# Patient Record
Sex: Female | Born: 1995 | Race: White | Hispanic: No | Marital: Single | Smoking: Never smoker
Health system: Southern US, Community
[De-identification: ages and names within clinical notes are randomized; demographics above are authoritative.]

## PROBLEM LIST (undated history)

## (undated) DIAGNOSIS — R569 Unspecified convulsions: Secondary | ICD-10-CM

## (undated) HISTORY — PX: HAND SURGERY: SHX662

---

## 2020-12-12 ENCOUNTER — Encounter: Payer: Self-pay | Admitting: Neurology

## 2021-01-01 ENCOUNTER — Other Ambulatory Visit: Payer: Self-pay

## 2021-01-01 ENCOUNTER — Encounter: Payer: Self-pay | Admitting: Neurology

## 2021-01-01 ENCOUNTER — Ambulatory Visit (INDEPENDENT_AMBULATORY_CARE_PROVIDER_SITE_OTHER): Payer: No Typology Code available for payment source | Admitting: Neurology

## 2021-01-01 VITALS — BP 103/71 | HR 115 | Resp 18 | Ht 59.0 in | Wt 118.0 lb

## 2021-01-01 DIAGNOSIS — R569 Unspecified convulsions: Secondary | ICD-10-CM | POA: Diagnosis not present

## 2021-01-01 DIAGNOSIS — Z8249 Family history of ischemic heart disease and other diseases of the circulatory system: Secondary | ICD-10-CM

## 2021-01-01 MED ORDER — LEVETIRACETAM ER 500 MG PO TB24: ORAL_TABLET | ORAL | 11 refills | Status: DC

## 2021-01-01 NOTE — Patient Instructions (Addendum)
1. Schedule MRI brain with and without contrast  2. Schedule MRA head without contrast  3. Schedule 1-hour EEG  4. Start Keppra XR (Levetiracetam ER) 500mg : Take 1 tablet every night  5. Follow-up in 3 months, call for any changes  We have sent a referral to [facility] for your MRI and MRA  they will call you directly to schedule your appointment. They are located at [address] [facility]. If you need to contact them directly please call [phone].

## 2021-01-01 NOTE — Progress Notes (Signed)
NEUROLOGY CONSULTATION NOTE  Michelle Peters MRN: [id number] DOB: 09-16-1996  Referring provider: Dr. Abner Greenspan Primary care provider: Dr. Abner Greenspan  Reason for consult:  seizure  Dear Dr Yetta Flock:  Thank you for your kind referral of Michelle Peters for consultation of the above symptoms. Although her history is well known to you, please allow me to reiterate it for the purpose of our medical record. She is alone in the office today. Records and images were personally reviewed where available.   HISTORY OF PRESENT ILLNESS: This is a 25 year old right-handed woman with a history of remote migraines presenting for evaluation of recurrent nocturnal seizures. The first seizure occurred in October, she does not remember being in the hospital [facility] - records not available for review) and took  2 weeks to feel normal. She felt "dazed, just not there." She is a big reader and likes to do Sudoku and would sit for hours not doing anything. She had another nocturnal seizure on Thanksgiving night and most recently on 10/31/20. They occur between 3-4AM lasting 2 minutes per husband. No tongue bite or incontinence. She denies any change in sleep patterns. She drinks 1 energy drink a day since age 25 which she would drink throughout the day. She had wine Thanksgiving night. She has been told she would tell her husband things she had already told him. Her sister has noticed she would forget they had just talked on the phone. She denies any staring/unresponsive episodes, olfactory/gustatory hallucinations, deja vu, rising epigastric sensation, focal numbness/tingling/weakness, myoclonic jerks. She had a pressure over her right eye 2 weeks ago, making her vision funny/blurred. This lasted for a week, she did not take any medication. 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 She was having these episodes once a week until she became  pregnant and has not had a single migraine since then. She has noticed an increase in epistaxis, she has had 3 recently. She has dizziness where she would be sitting and feel a sensation of movement for 30 seconds, sometimes occurring three times a day, no associated confusion. She has back pain, no bowel/bladder dysfunction. She had 5 concussions in high school while playing hockey, head injury during skiing, no neurosurgical procedures. A maternal cousin had seizures in childhood. Her paternal grandfather had a brain aneurysm. She had a normal birth and early development.  There is no history of febrile convulsions, CNS infections such as meningitis/encephalitis, significant traumatic brain injury.   PAST MEDICAL HISTORY: History reviewed. No pertinent past medical history.  PAST SURGICAL HISTORY: History reviewed. No pertinent surgical history.  MEDICATIONS: No current outpatient medications on file prior to visit.   No current facility-administered medications on file prior to visit.    ALLERGIES: No Known Allergies  FAMILY HISTORY: History reviewed. No pertinent family history.  SOCIAL HISTORY: Social History   Socioeconomic History  . Marital status: Single    Spouse name: Not on file  . Number of children: 1  . Years of education: 28  . Highest education level: Not on file  Occupational History  . Occupation: home maker  Tobacco Use  . Smoking status: Never Smoker  . Smokeless tobacco: Never Used  Vaping Use  . Vaping Use: Never used  Substance and Sexual Activity  . Alcohol use: Yes  . Drug use: Not on file  . Sexual activity: Not on file  Other Topics  Concern  . Not on file  Social History Narrative   Right handed   Drinks caffeine   Two story home   Social Determinants of Health   Financial Resource Strain: Not on file  Food Insecurity: Not on file  Transportation Needs: Not on file  Physical Activity: Not on file  Stress: Not on file  Social  Connections: Not on file  Intimate Partner Violence: Not on file     PHYSICAL EXAM: Vitals:   01/01/21 1036  BP: 103/71  Pulse: (!) 115  Resp: 18  SpO2: 96%   General: No acute distress Head:  Normocephalic/atraumatic Skin/Extremities: No rash, no edema Neurological Exam: Mental status: alert and oriented to person, place, and time, no dysarthria or aphasia, Fund of knowledge is appropriate.  Recent and remote memory are intact, 3/3 delayed recall.  Attention and concentration are normal, 3/5 WORLD backwards. Cranial nerves: CN I: not tested CN II: pupils equal, round and reactive to light, visual fields intact CN III, IV, VI:  full range of motion, no nystagmus, no ptosis CN V: facial sensation intact CN VII: upper and lower face symmetric CN VIII: hearing intact to conversation Bulk & Tone: normal, no fasciculations. Motor: 5/5 throughout with no pronator drift. Sensation: intact to light touch, cold, pin, vibration sense.  No extinction to double simultaneous stimulation.  Romberg test negative Deep Tendon Reflexes: +2 throughout Cerebellar: no incoordination on finger to nose testing Gait: narrow-based and steady, able to tandem walk adequately. Tremor: none   IMPRESSION: This is a 25 year old right-handed woman with a history of remote migraines presenting for evaluation of recurrent nocturnal seizures since October 2021. Etiology unclear, MRI brain with and without contrast and a 1-hour EEG will be ordered to assess for focal abnormalities that increase risk for recurrent seizures. Due to family history of cerebral aneurysm, MRA head without contrast will be done as well. We discussed recommendation to start seizure medication, she is agreeable to starting Levetiracetam ER 500mg  qhs, side effects discussed. Low threshold to increase dose if needed. [place] driving laws were discussed with the patient, and she knows to stop driving after a seizure, until 6 months seizure-free.  Follow-up in 3 months, she knows to call for any changes.  Thank you for allowing me to participate in the care of this patient. Please do not hesitate to call for any questions or concerns.   , M.D.  CC: Dr. Patrcia Dolly

## 2021-01-10 ENCOUNTER — Ambulatory Visit: Payer: No Typology Code available for payment source | Admitting: Neurology

## 2021-01-10 ENCOUNTER — Other Ambulatory Visit: Payer: Self-pay

## 2021-01-10 DIAGNOSIS — Z8249 Family history of ischemic heart disease and other diseases of the circulatory system: Secondary | ICD-10-CM

## 2021-01-10 DIAGNOSIS — R569 Unspecified convulsions: Secondary | ICD-10-CM

## 2021-01-15 NOTE — Procedures (Signed)
ELECTROENCEPHALOGRAM REPORT  Date of Study: 01/10/2021  Patient's Name: Michelle Peters MRN: [id number] Date of Birth: October 18, 1996  Referring Provider: Dr. Patrcia Dolly  Clinical History: This is a 25 year old woman with recurrent nocturnal seizures.   Medications: Keppra XR  Technical Summary: A multichannel digital 1-hour EEG recording measured by the international 10-20 system with electrodes applied with paste and impedances below 5000 ohms performed in our laboratory with EKG monitoring in an awake and asleep patient.  Hyperventilation was not performed. Photic stimulation was performed.  The digital EEG was referentially recorded, reformatted, and digitally filtered in a variety of bipolar and referential montages for optimal display.    Description: The patient is awake and asleep during the recording.  During maximal wakefulness, there is a symmetric, medium voltage 11 Hz posterior dominant rhythm that attenuates with eye opening.  The record is symmetric.  During drowsiness and sleep, there is an increase in theta slowing of the background.  Vertex waves and symmetric sleep spindles were seen. Photic stimulation did not elicit any abnormalities.  There were no epileptiform discharges or electrographic seizures seen.    EKG lead was unremarkable.  Impression: This 1-hour awake and asleep EEG is normal.    Clinical Correlation: A normal EEG does not exclude a clinical diagnosis of epilepsy.  If further clinical questions remain, prolonged EEG may be helpful.  Clinical correlation is advised.   Patrcia Dolly, M.D.

## 2021-01-31 ENCOUNTER — Ambulatory Visit
Admission: RE | Admit: 2021-01-31 | Discharge: 2021-01-31 | Disposition: A | Discharge status: Home / Self Care | Payer: No Typology Code available for payment source | Source: Ambulatory Visit | Attending: Neurology | Admitting: Neurology

## 2021-01-31 IMAGING — MR MR MRA HEAD W/O CM
1 series · 22 of 48 positions shown · IV contrast (multihance)
Comparison: Head CT [DATE]

CLINICAL DATA: Seizure.

EXAM:
MRI HEAD WITHOUT AND WITH CONTRAST
MRA HEAD WITHOUT CONTRAST
TECHNIQUE: Multiplanar, multiecho pulse sequences of the brain and surrounding
structures were obtained without and with intravenous contrast.
Angiographic images of the head were obtained using MRA technique
without contrast.
CONTRAST:  10mL MULTIHANCE GADOBENATE DIMEGLUMINE 529 MG/ML IV SOLN

[Series 3: tof_3d_multi-slab · axial · 0.7mm · 0.35mm/px · z∈[-44,+50]mm · 22 of 143 slices shown]
[im 1/143]
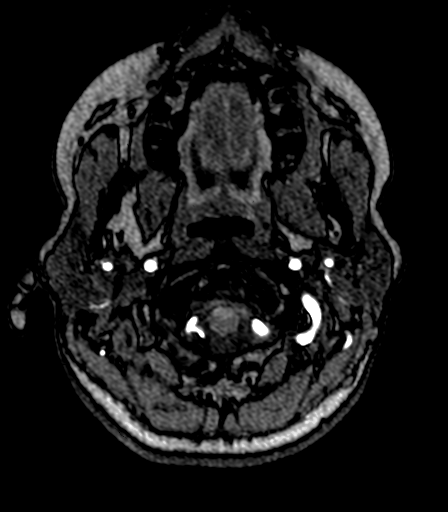
[im 4/143]
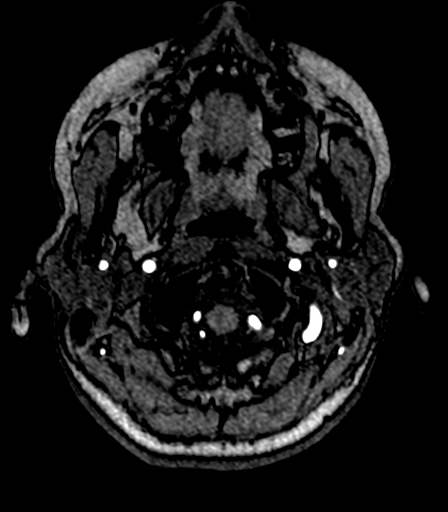
[im 7/143]
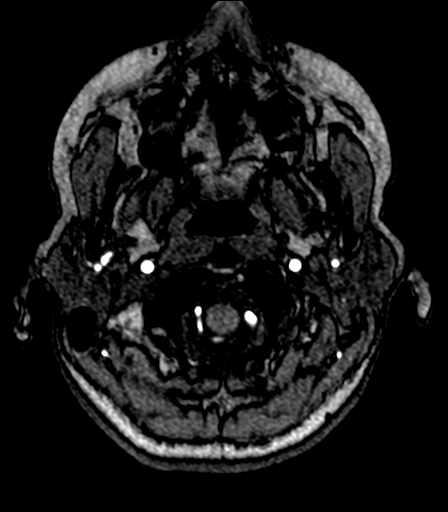
[im 10/143]
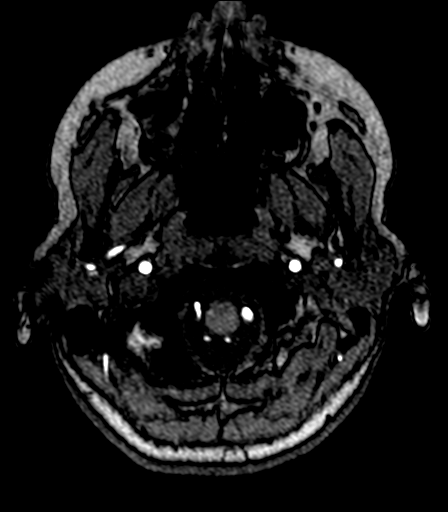
[im 13/143]
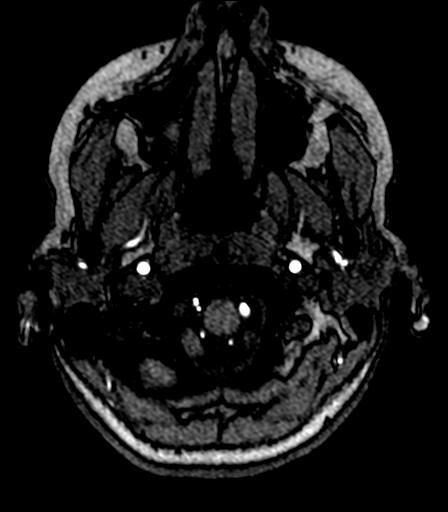
[im 16/143]
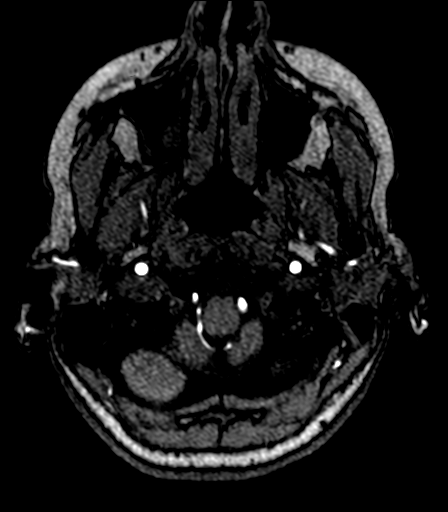
[im 19/143]
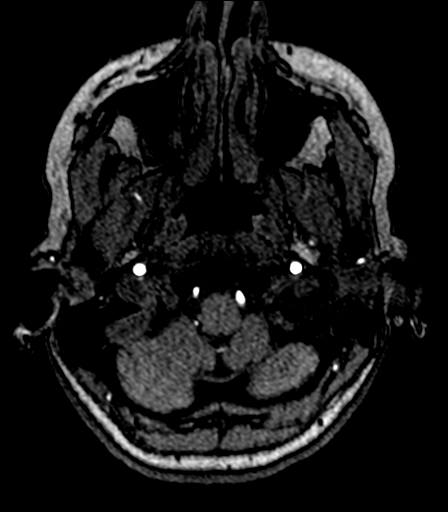
[im 22/143]
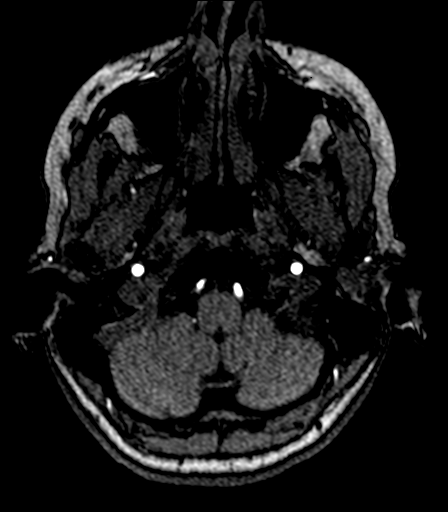
[im 25/143]
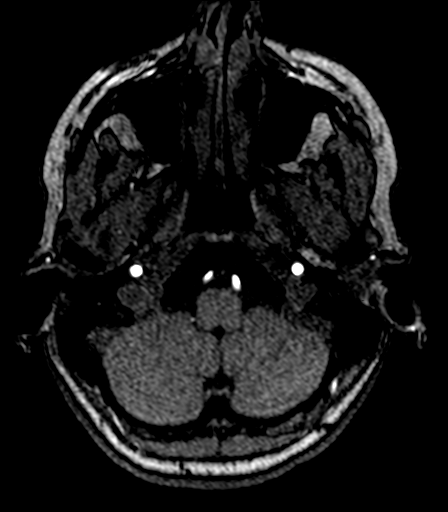
[im 28/143]
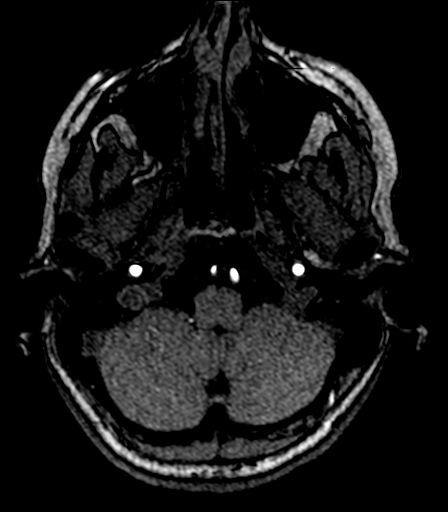
[im 31/143]
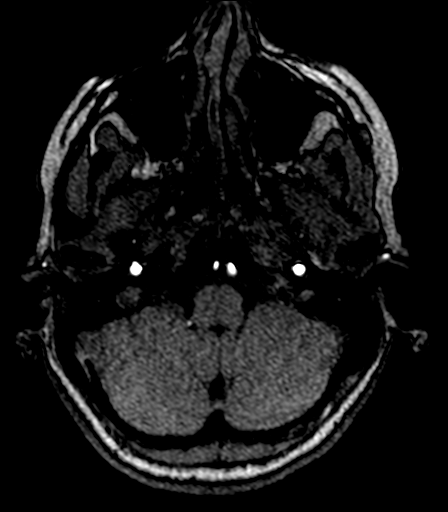
[im 34/143]
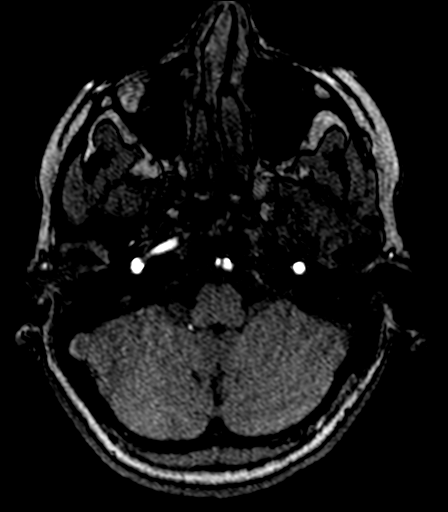
[im 37/143]
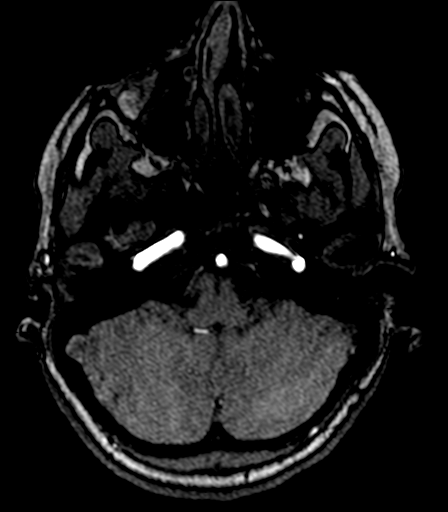
[im 40/143]
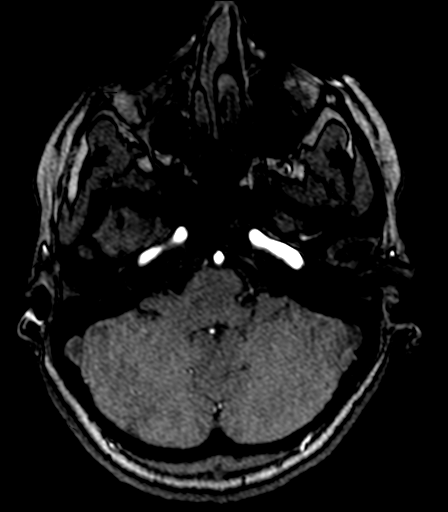
[im 46/143]
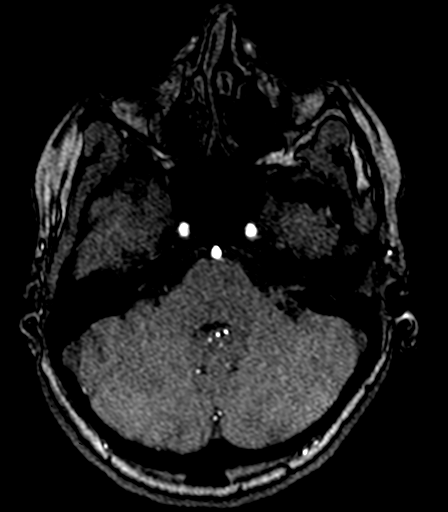
[im 64/143]
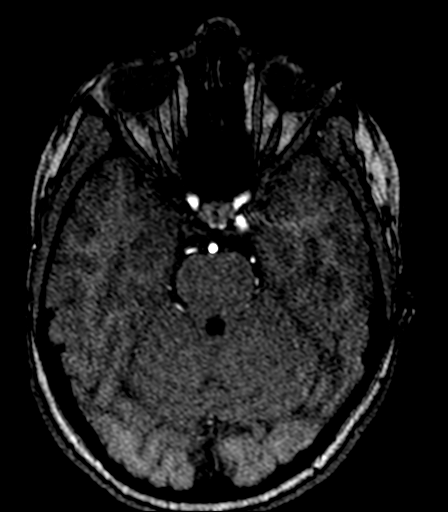
[im 73/143]
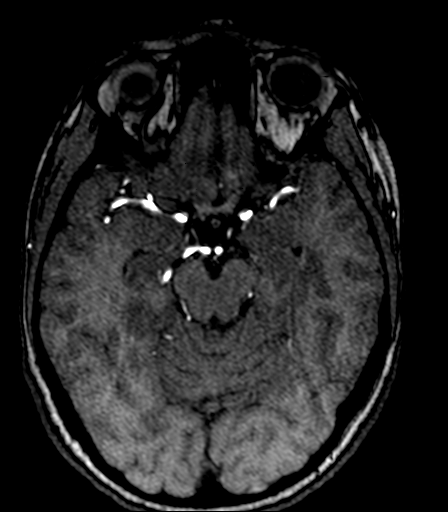
[im 82/143]
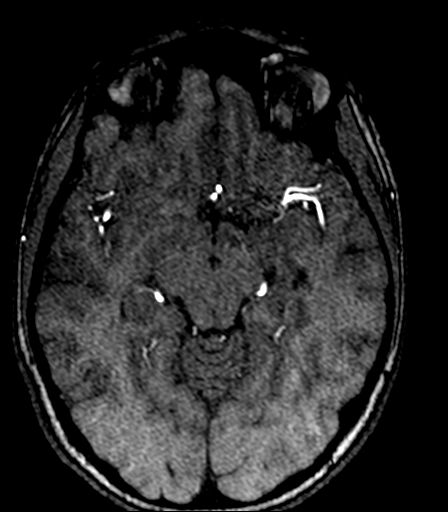
[im 100/143]
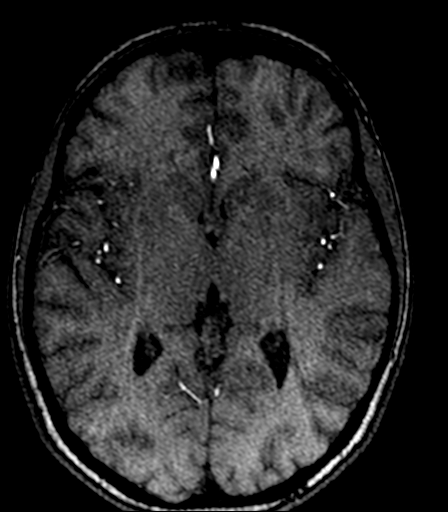
[im 118/143]
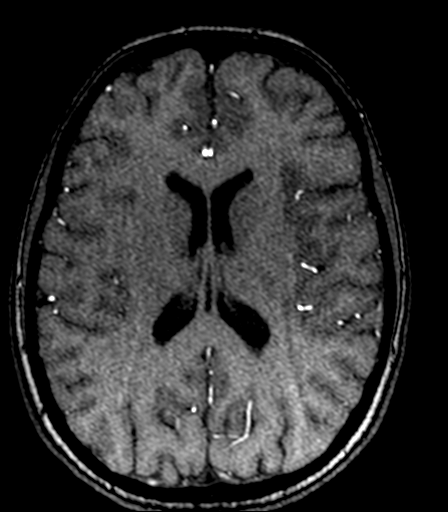
[im 121/143]
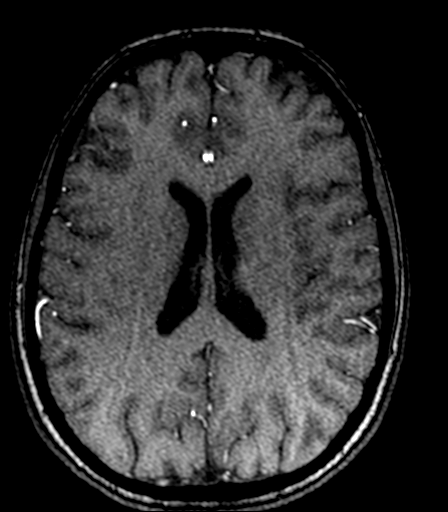
[im 136/143]
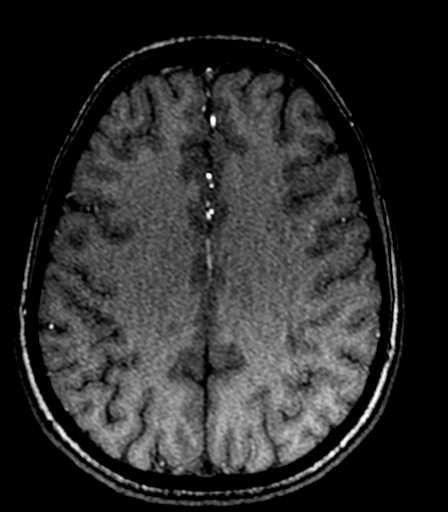

[22 of 48 positions shown; findings below may reference images not displayed]

FINDINGS: MRI HEAD FINDINGS

Brain: No acute infarction, hemorrhage, hydrocephalus, extra-axial
collection or mass lesion. The brain parenchyma has normal signal
characteristics with no evidence of abnormal contrast enhancement.
Incomplete left hippocampal inversion is noted.

Vascular: Normal flow voids.

Skull and upper cervical spine: Normal marrow signal.

Sinuses/Orbits: Mild mucosal thickening of the right maxillary
sinus. The orbits are maintained.

MRA HEAD FINDINGS

The visualized portions of the distal cervical and intracranial
internal carotid arteries are widely patent with normal flow related
enhancement. The bilateral anterior cerebral arteries and middle
cerebral arteries are widely patent with antegrade flow without
high-grade flow-limiting stenosis or proximal branch occlusion. No
intracranial aneurysm within the anterior circulation.

The vertebral arteries are widely patent with antegrade flow.
Vertebrobasilar junction and basilar artery are widely patent with
antegrade flow without evidence of basilar stenosis or aneurysm.
Posterior cerebral arteries are normal bilaterally. No intracranial
aneurysm within the posterior circulation.
IMPRESSION: 1. Incomplete left hippocampal inversion.
2. Otherwise normal MRI of the brain.
3. Normal MRA of the head.

## 2021-01-31 IMAGING — MR MR HEAD WO/W CM
13 of 14 series · 43 of 48 positions shown · IV contrast (multihance)
Comparison: Head CT [DATE]

CLINICAL DATA: Seizure.

EXAM:
MRI HEAD WITHOUT AND WITH CONTRAST
MRA HEAD WITHOUT CONTRAST
TECHNIQUE: Multiplanar, multiecho pulse sequences of the brain and surrounding
structures were obtained without and with intravenous contrast.
Angiographic images of the head were obtained using MRA technique
without contrast.
CONTRAST:  10mL MULTIHANCE GADOBENATE DIMEGLUMINE 529 MG/ML IV SOLN

[Series 2: T1 · sagittal · 5.0mm · 0.45mm/px · 2 of 21 slices shown]
[im 1/21]
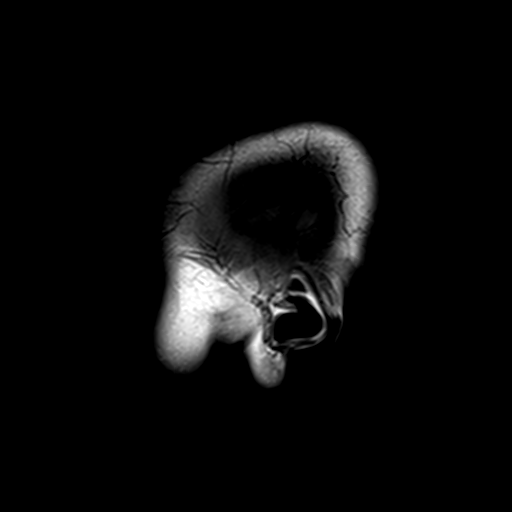
[im 21/21]
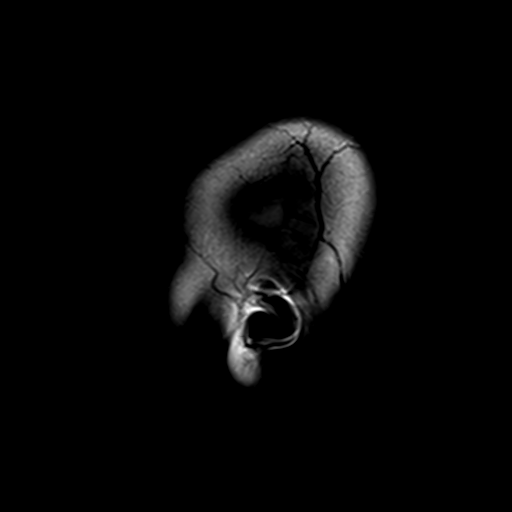

[Series 3: DWI · axial · 3.0mm · 1.80mm/px · z∈[-47,+99]mm · 6 of 99 slices shown (1 of 4)]
[im 1/99]
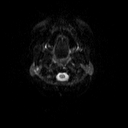
[im 20/99]
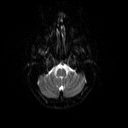
[im 40/99]
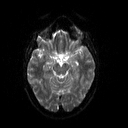
[im 59/99]
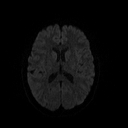
[im 79/99]
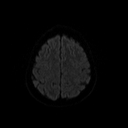
[im 99/99]
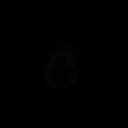

[Series 4: DWI · axial · 3.0mm · 1.80mm/px · z∈[-47,+99]mm · 3 of 48 slices shown (2 of 4)]
[im 1/48]
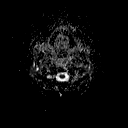
[im 24/48]
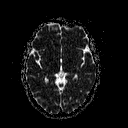
[im 48/48]
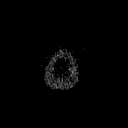

[Series 5: DWI · coronal · 5.0mm · 1.80mm/px · 4 of 68 slices shown (3 of 4)]
[im 1/68]
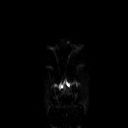
[im 23/68]
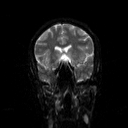
[im 45/68]
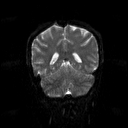
[im 68/68]
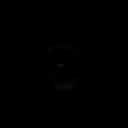

[Series 6: DWI · coronal · 5.0mm · 1.80mm/px · 2 of 34 slices shown (4 of 4)]
[im 1/34]
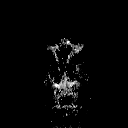
[im 34/34]
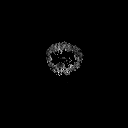

[Series 7: T2 · axial · 5.0mm · 0.60mm/px · 1 of 22 slices shown (1 of 3)]
[im 1/22]
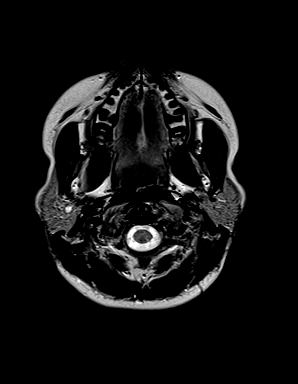

[Series 8: FLAIR · axial · 3.0mm · 0.45mm/px · z∈[-41,+93]mm · 2 of 30 slices shown (1 of 2)]
[im 1/30]
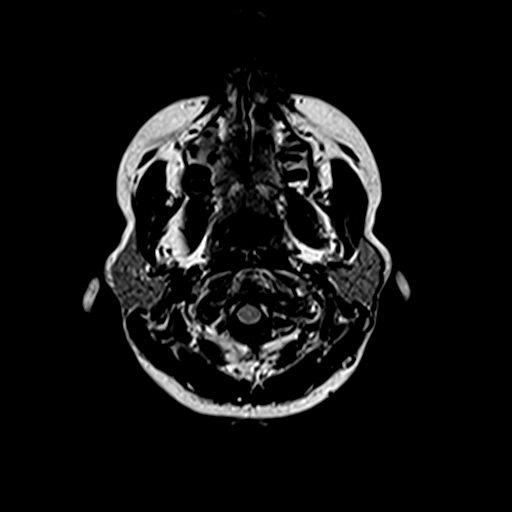
[im 30/30]
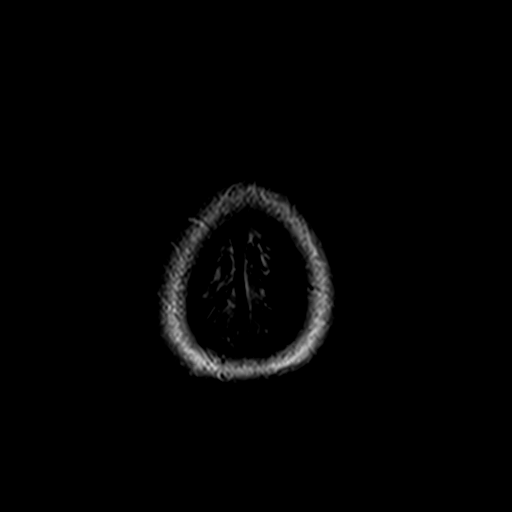

[Series 9: FLAIR · coronal · 3.0mm · 0.40mm/px · 2 of 30 slices shown (2 of 2)]
[im 1/30]
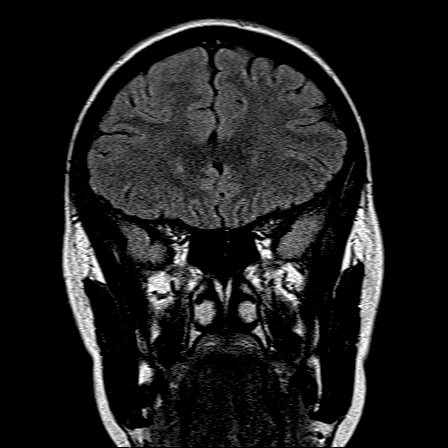
[im 30/30]
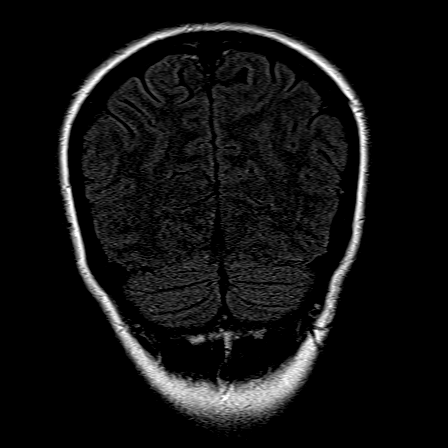

[Series 10: T2 · coronal · 3.0mm · 0.23mm/px · 2 of 30 slices shown (2 of 3)]
[im 1/30]
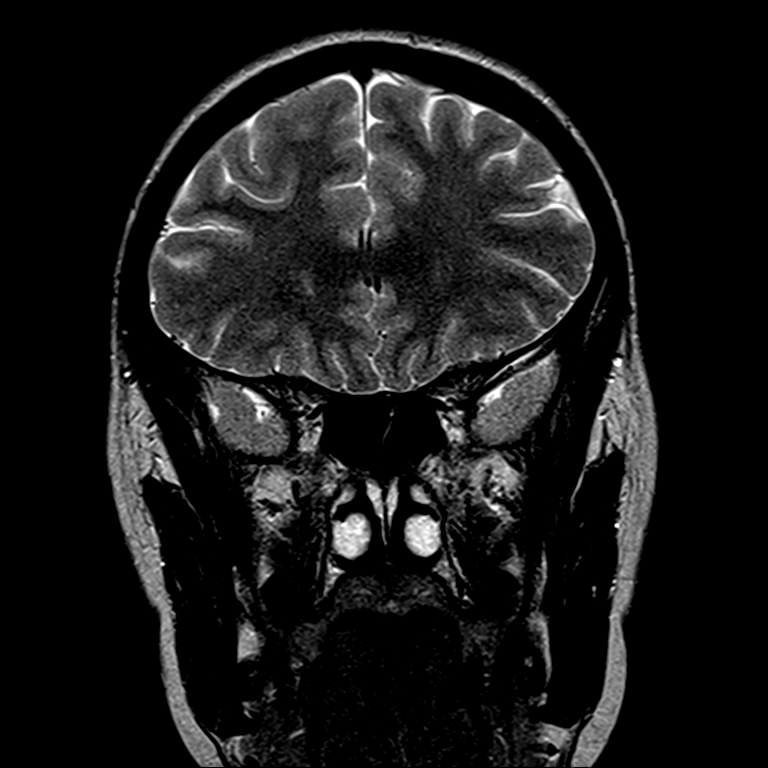
[im 30/30]
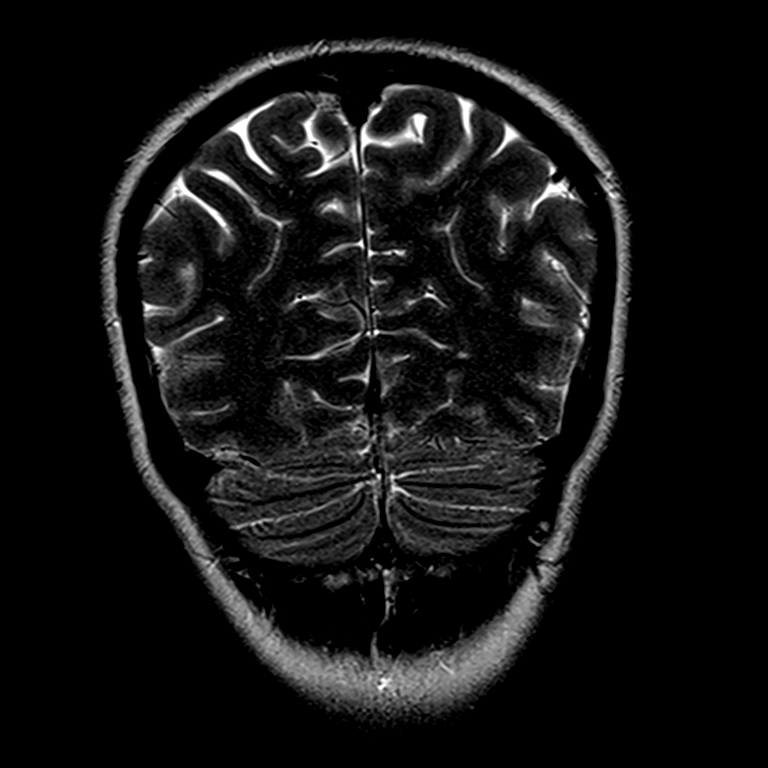

[Series 12: swi_images · axial · 4.0mm · 0.90mm/px · z∈[-43,+96]mm · 2 of 36 slices shown]
[im 1/36]
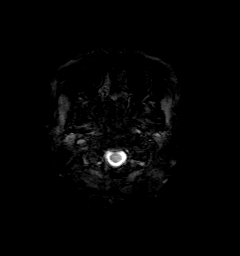
[im 36/36]
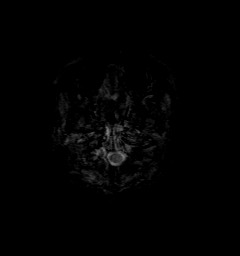

[Series 13: t1_mpr_tra · axial · 1.0mm · 0.75mm/px · z∈[-36,+106]mm · 8 of 144 slices shown]
[im 1/144]
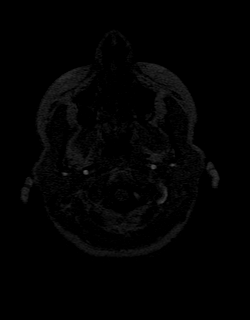
[im 18/144]
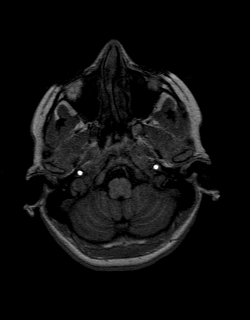
[im 36/144]
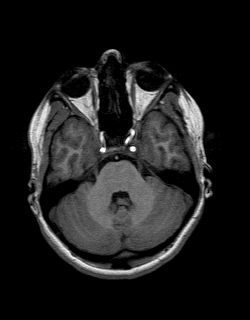
[im 54/144]
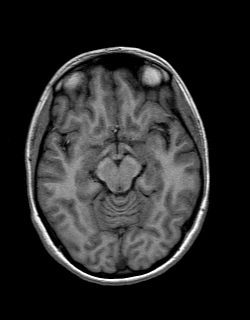
[im 90/144]
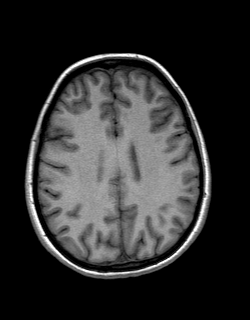
[im 108/144]
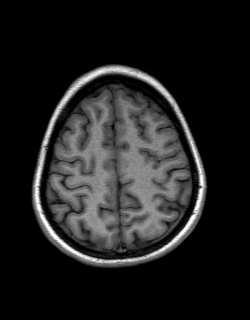
[im 126/144]
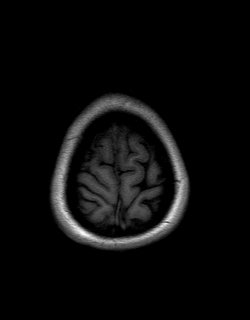
[im 144/144]
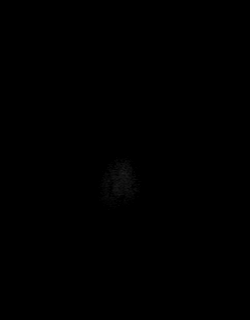

[Series 14: T2 · coronal · 5.0mm · 0.45mm/px · 2 of 25 slices shown (3 of 3)]
[im 1/25]
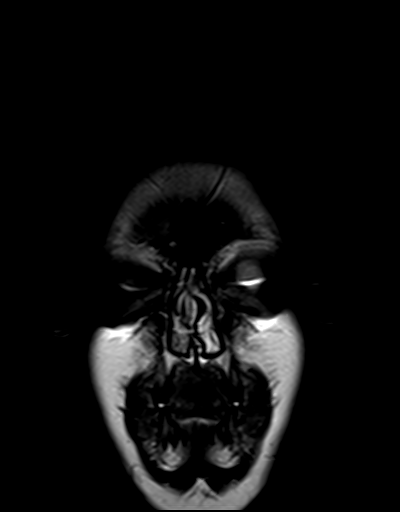
[im 25/25]
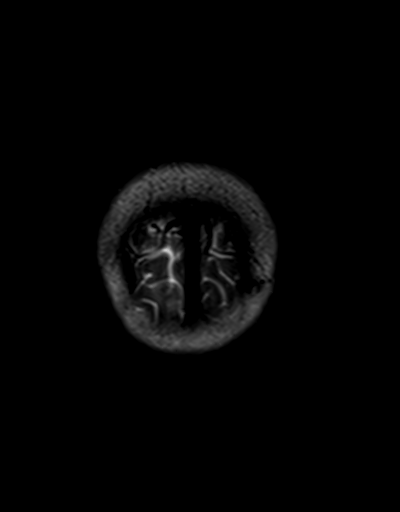

[Series 15: t1_mpr_tra post · axial · 1.0mm · 0.75mm/px · z∈[-36,+89]mm · 7 of 144 slices shown]
[im 1/144]
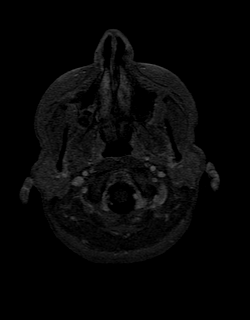
[im 18/144]
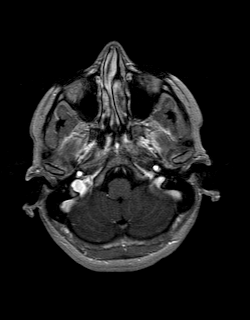
[im 36/144]
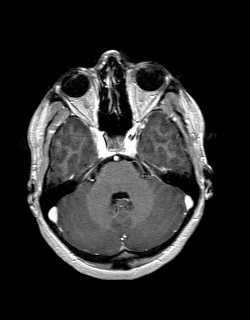
[im 54/144]
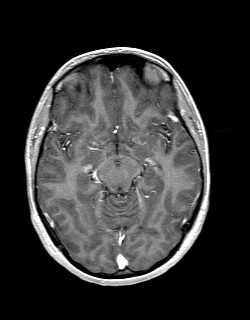
[im 90/144]
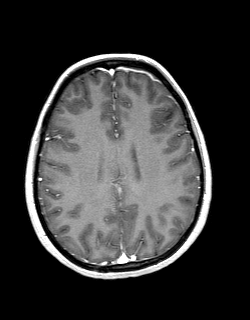
[im 108/144]
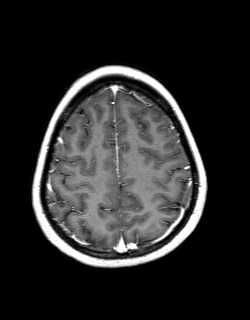
[im 126/144]
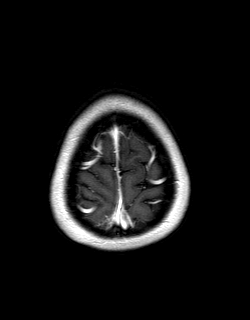

[43 of 48 positions shown; findings below may reference images not displayed]

FINDINGS: MRI HEAD FINDINGS

Brain: No acute infarction, hemorrhage, hydrocephalus, extra-axial
collection or mass lesion. The brain parenchyma has normal signal
characteristics with no evidence of abnormal contrast enhancement.
Incomplete left hippocampal inversion is noted.

Vascular: Normal flow voids.

Skull and upper cervical spine: Normal marrow signal.

Sinuses/Orbits: Mild mucosal thickening of the right maxillary
sinus. The orbits are maintained.

MRA HEAD FINDINGS

The visualized portions of the distal cervical and intracranial
internal carotid arteries are widely patent with normal flow related
enhancement. The bilateral anterior cerebral arteries and middle
cerebral arteries are widely patent with antegrade flow without
high-grade flow-limiting stenosis or proximal branch occlusion. No
intracranial aneurysm within the anterior circulation.

The vertebral arteries are widely patent with antegrade flow.
Vertebrobasilar junction and basilar artery are widely patent with
antegrade flow without evidence of basilar stenosis or aneurysm.
Posterior cerebral arteries are normal bilaterally. No intracranial
aneurysm within the posterior circulation.
IMPRESSION: 1. Incomplete left hippocampal inversion.
2. Otherwise normal MRI of the brain.
3. Normal MRA of the head.

## 2021-01-31 MED ORDER — GADOBENATE DIMEGLUMINE 529 MG/ML IV SOLN
10.0000 mL | Freq: Once | INTRAVENOUS | Status: AC | PRN
  Administered 2021-01-31: 10 mL via INTRAVENOUS

## 2021-02-04 ENCOUNTER — Telehealth: Payer: Self-pay

## 2021-02-04 NOTE — Telephone Encounter (Signed)
-----   Message from Karen M Aquino, MD sent at 02/04/2021 12:51 PM EDT ----- Pls let her know the brain MRI and MRA looked fine, no evidence of tumor, stroke, bleed, or aneurysm. Her EEG as normal, however it is not like a pregnancy test that is positive or negative, it is just a snapshot of her brain waves. How is she feeling on the Keppra? thanks 

## 2021-02-04 NOTE — Telephone Encounter (Signed)
Pt called phone was hung up

## 2021-02-06 ENCOUNTER — Telehealth: Payer: Self-pay

## 2021-02-06 NOTE — Telephone Encounter (Signed)
Pt called to go over MRI results no answer left a voice mail to call the office back

## 2021-02-06 NOTE — Telephone Encounter (Signed)
Pt called back stated that she has not gotten the Keppra she is trying to get pregnant and scared to take any medications, she stated that she did have a seizure 2 weeks ago  Pt given results brain MRI and MRA looked fine, no evidence of tumor, stroke, bleed, or aneurysm. Her EEG as normal, however it is not like a pregnancy test that is positive or negative, it is just a snapshot of her brain waves

## 2021-02-06 NOTE — Telephone Encounter (Signed)
-----   Message from Karen M Aquino, MD sent at 02/04/2021 12:51 PM EDT ----- Pls let her know the brain MRI and MRA looked fine, no evidence of tumor, stroke, bleed, or aneurysm. Her EEG as normal, however it is not like a pregnancy test that is positive or negative, it is just a snapshot of her brain waves. How is she feeling on the Keppra? thanks 

## 2021-02-06 NOTE — Telephone Encounter (Signed)
-----   Message from Van Clines, MD sent at 02/04/2021 12:51 PM EDT ----- Pls let her know the brain MRI and MRA looked fine, no evidence of tumor, stroke, bleed, or aneurysm. Her EEG as normal, however it is not like a pregnancy test that is positive or negative, it is just a snapshot of her brain waves. How is she feeling on the Keppra? thanks

## 2021-02-07 MED ORDER — LEVETIRACETAM ER 500 MG PO TB24
ORAL_TABLET | ORAL | 11 refills | Status: AC
Start: 2021-02-07 — End: ?

## 2021-02-07 NOTE — Addendum Note (Signed)
Addended by: Van Clines on: 02/07/2021 02:11 PM   Modules accepted: Orders

## 2021-02-07 NOTE — Telephone Encounter (Signed)
Spoke to patient, she had another nocturnal seizure maybe 2 weeks ago, next day feeling weird. She and her husband discussed it and she now agrees to start the Keppra. Discussed we are starting low dose Keppra XR 500mg  qhs, call for any issues.

## 2021-02-21 ENCOUNTER — Ambulatory Visit: Payer: Self-pay | Admitting: Neurology

## 2021-04-08 ENCOUNTER — Ambulatory Visit: Payer: No Typology Code available for payment source | Admitting: Neurology

## 2022-06-20 ENCOUNTER — Other Ambulatory Visit: Payer: Self-pay

## 2022-06-20 ENCOUNTER — Emergency Department (HOSPITAL_BASED_OUTPATIENT_CLINIC_OR_DEPARTMENT_OTHER)
Admission: EM | Admit: 2022-06-20 | Discharge: 2022-06-20 | Disposition: A | Discharge status: Home / Self Care | Payer: BLUE CROSS/BLUE SHIELD | Attending: Emergency Medicine | Admitting: Emergency Medicine

## 2022-06-20 ENCOUNTER — Emergency Department (HOSPITAL_BASED_OUTPATIENT_CLINIC_OR_DEPARTMENT_OTHER): Payer: BLUE CROSS/BLUE SHIELD

## 2022-06-20 ENCOUNTER — Encounter (HOSPITAL_BASED_OUTPATIENT_CLINIC_OR_DEPARTMENT_OTHER): Payer: Self-pay

## 2022-06-20 DIAGNOSIS — R300 Dysuria: Secondary | ICD-10-CM | POA: Diagnosis present

## 2022-06-20 DIAGNOSIS — N12 Tubulo-interstitial nephritis, not specified as acute or chronic: Secondary | ICD-10-CM | POA: Insufficient documentation

## 2022-06-20 DIAGNOSIS — R000 Tachycardia, unspecified: Secondary | ICD-10-CM | POA: Insufficient documentation

## 2022-06-20 DIAGNOSIS — R109 Unspecified abdominal pain: Secondary | ICD-10-CM | POA: Diagnosis not present

## 2022-06-20 HISTORY — DX: Unspecified convulsions: R56.9

## 2022-06-20 LAB — URINALYSIS, ROUTINE W REFLEX MICROSCOPIC
Glucose, UA: NEGATIVE mg/dL
Ketones, ur: 15 mg/dL — AB
Nitrite: POSITIVE — AB
Protein, ur: 30 mg/dL — AB
Specific Gravity, Urine: 1.017 (ref 1.005–1.030)
pH: 5.5 (ref 5.0–8.0)

## 2022-06-20 LAB — LACTIC ACID, PLASMA: Lactic Acid, Venous: 0.7 mmol/L (ref 0.5–1.9)

## 2022-06-20 LAB — BASIC METABOLIC PANEL
Anion gap: 11 (ref 5–15)
BUN: 10 mg/dL (ref 6–20)
CO2: 23 mmol/L (ref 22–32)
Calcium: 9.1 mg/dL (ref 8.9–10.3)
Chloride: 102 mmol/L (ref 98–111)
Creatinine, Ser: 0.73 mg/dL (ref 0.44–1.00)
GFR, Estimated: >60 mL/min (ref >60)
Glucose, Bld: 102 mg/dL — ABNORMAL HIGH (ref 70–99)
Potassium: 3.6 mmol/L (ref 3.5–5.1)
Sodium: 136 mmol/L (ref 135–145)

## 2022-06-20 LAB — CBC
HCT: 38.2 % (ref 36.0–46.0)
Hemoglobin: 12.9 g/dL (ref 12.0–15.0)
MCH: 29.9 pg (ref 26.0–34.0)
MCHC: 33.8 g/dL (ref 30.0–36.0)
MCV: 88.4 fL (ref 80.0–100.0)
Platelets: 167 10*3/uL (ref 150–400)
RBC: 4.32 MIL/uL (ref 3.87–5.11)
RDW: 12.3 % (ref 11.5–15.5)
WBC: 13.1 10*3/uL — ABNORMAL HIGH (ref 4.0–10.5)
nRBC: 0 % (ref 0.0–0.2)

## 2022-06-20 LAB — PREGNANCY, URINE: Preg Test, Ur: NEGATIVE

## 2022-06-20 MED ORDER — KETOROLAC TROMETHAMINE 15 MG/ML IJ SOLN
15.0000 mg | Freq: Once | INTRAMUSCULAR | Status: AC
  Administered 2022-06-20: 15 mg via INTRAVENOUS
  Filled 2022-06-20: qty 1

## 2022-06-20 MED ORDER — LACTATED RINGERS IV BOLUS
2000.0000 mL | Freq: Once | INTRAVENOUS | Status: AC
  Administered 2022-06-20: 2000 mL via INTRAVENOUS

## 2022-06-20 MED ORDER — SODIUM CHLORIDE 0.9 % IV SOLN
2.0000 g | Freq: Once | INTRAVENOUS | Status: AC
  Administered 2022-06-20: 2 g via INTRAVENOUS
  Filled 2022-06-20: qty 20

## 2022-06-20 NOTE — Discharge Instructions (Addendum)
Continue the prescriptions given to you by the urgent care.  Make sure to take the antibiotics until they are complete.  We did send a urine culture today to make sure the antibiotic you were given will work on the bacteria causing your urinary tract infection.  We will call if you need to have your medications adjusted.  Return to the ER for vomiting worsening symptoms.  You should start to feel better in the next couple of days

## 2022-06-20 NOTE — ED Triage Notes (Signed)
Patient here POV from Home.  Endorses Subjective Fever and Dysuria. 101 Fever at Home today.   Went to UC today and was prescribed Several Medications including Antibiotics but felt worse shortly afterwards and was advised to seek ED Evaluation.   NAD Noted during Triage. A&Ox4. GCS 15. Ambulatory.

## 2022-06-20 NOTE — ED Provider Notes (Signed)
[facility] EMERGENCY DEPT Provider Note   CSN: [id number] Arrival date & time: 06/20/22  1941     History  Chief Complaint  Patient presents with  . Dysuria    Michelle Peters is a 26 y.o. female.   Dysuria   Patient states she started having symptoms today of malodorous urine.  She also started experiencing some fevers up to 101.  Patient noted she was having some pain in her right back.  She went to an urgent care and they diagnosed her with possible kidney infection.  She states she was given an antibiotic injection as well as prescriptions for several medications.  Patient was told that she should start to feel slightly better after the antibiotic injection.  She was not feeling any better this evening.  She has noticed that her heart rate has been fast.  Her watch had been alerting her.  Patient states she has been in bed most of the day.  No vomiting or diarrhea.  Home Medications Prior to Admission medications   Medication Sig Start Date End Date Taking? Authorizing Provider  levETIRAcetam (KEPPRA XR) 500 MG 24 hr tablet Take 1 tablet every night 02/07/21   Van Clines, MD      Allergies    Patient has no known allergies.    Review of Systems   Review of Systems  Genitourinary:  Positive for dysuria.    Physical Exam Updated Vital Signs BP 109/82 (BP Location: Right Arm)   Pulse (!) 123   Temp 98.8 F (37.1 C)   Resp 17   Ht 1.499 m (4\' 11" )   Wt 53.5 kg   SpO2 98%   BMI 23.82 kg/m  Physical Exam Vitals and nursing note reviewed.  Constitutional:      General: She is not in acute distress.    Appearance: She is well-developed.  HENT:     Head: Normocephalic and atraumatic.     Right Ear: External ear normal.     Left Ear: External ear normal.  Eyes:     General: No scleral icterus.       Right eye: No discharge.        Left eye: No discharge.     Conjunctiva/sclera: Conjunctivae normal.  Neck:     Trachea: No tracheal deviation.   Cardiovascular:     Rate and Rhythm: Regular rhythm. Tachycardia present.  Pulmonary:     Effort: Pulmonary effort is normal. No respiratory distress.     Breath sounds: Normal breath sounds. No stridor. No wheezing or rales.  Abdominal:     General: Bowel sounds are normal. There is no distension.     Palpations: Abdomen is soft.     Tenderness: There is no abdominal tenderness. There is right CVA tenderness. There is no guarding or rebound.  Musculoskeletal:        General: No tenderness or deformity.     Cervical back: Neck supple.  Skin:    General: Skin is warm and dry.     Findings: No rash.  Neurological:     General: No focal deficit present.     Mental Status: She is alert.     Cranial Nerves: No cranial nerve deficit (no facial droop, extraocular movements intact, no slurred speech).     Sensory: No sensory deficit.     Motor: No abnormal muscle tone or seizure activity.     Coordination: Coordination normal.  Psychiatric:        Mood and  Affect: Mood normal.     ED Results / Procedures / Treatments   Labs (all labs ordered are listed, but only abnormal results are displayed) Labs Reviewed  URINALYSIS, ROUTINE W REFLEX MICROSCOPIC - Abnormal; Notable for the following components:      Result Value   Color, Urine ORANGE (*)    Hgb urine dipstick SMALL (*)    Bilirubin Urine SMALL (*)    Ketones, ur 15 (*)    Protein, ur 30 (*)    Nitrite POSITIVE (*)    Leukocytes,Ua SMALL (*)    Non Squamous Epithelial 0-5 (*)    All other components within normal limits  CBC - Abnormal; Notable for the following components:   WBC 13.1 (*)    All other components within normal limits  BASIC METABOLIC PANEL - Abnormal; Notable for the following components:   Glucose, Bld 102 (*)    All other components within normal limits  URINE CULTURE  PREGNANCY, URINE  LACTIC ACID, PLASMA    EKG None  Radiology CT Renal Stone Study  Result Date: 06/20/2022 CLINICAL DATA:   Fever and flank pain, initial encounter EXAM: CT ABDOMEN AND PELVIS WITHOUT CONTRAST TECHNIQUE: Multidetector CT imaging of the abdomen and pelvis was performed following the standard protocol without IV contrast. RADIATION DOSE REDUCTION: This exam was performed according to the departmental dose-optimization program which includes automated exposure control, adjustment of the mA and/or kV according to patient size and/or use of iterative reconstruction technique. COMPARISON:  None Available. FINDINGS: Lower chest: No acute abnormality. Hepatobiliary: No focal liver abnormality is seen. No gallstones, gallbladder wall thickening, or biliary dilatation. Pancreas: Unremarkable. No pancreatic ductal dilatation or surrounding inflammatory changes. Spleen: Normal in size without focal abnormality. Adrenals/Urinary Tract: Adrenal glands are within normal limits. Kidneys are well visualized bilaterally. Mild perinephric stranding is noted along the inferior aspect of the right kidney. This raises suspicion for local pyelonephritis. Correlation with the lab values is recommended. No renal calculi are seen. No obstructive changes are noted. The bladder is partially distended. Stomach/Bowel: No obstructive or inflammatory changes of the colon are seen. Small bowel and stomach are within normal limits. The appendix is not well visualized although no inflammatory changes to suggest appendicitis are noted. Vascular/Lymphatic: No significant vascular findings are present. No enlarged abdominal or pelvic lymph nodes. Reproductive: Uterus and bilateral adnexa are unremarkable. Tampon is noted in the vaginal vault. Other: No abdominal wall hernia or abnormality. No abdominopelvic ascites. Musculoskeletal: No acute or significant osseous findings. IMPRESSION: Mild perinephric stranding in the right kidney without obstructive change. This may represent mild pyelonephritis. Correlate with laboratory values. Nonvisualization of the  appendix although no inflammatory changes are seen. No other focal abnormality is noted. Electronically Signed   By: Alcide Clever M.D.   On: 06/20/2022 21:33    Procedures Procedures    Medications Ordered in ED Medications  lactated ringers bolus 2,000 mL (2,000 mLs Intravenous New Bag/Given 06/20/22 2146)  cefTRIAXone (ROCEPHIN) 2 g in sodium chloride 0.9 % 100 mL IVPB (2 g Intravenous New Bag/Given 06/20/22 2146)  ketorolac (TORADOL) 15 MG/ML injection 15 mg (15 mg Intravenous Given 06/20/22 2146)    ED Course/ Medical Decision Making/ A&P Clinical Course as of 06/20/22 2321  Sat Jun 20, 2022  2220 CT Renal Stone Study CT scan findings reviewed.  Suggestive of pyelonephritis but no obstructing ureteral stone [JK]  2319 Labs notable for leukocytosis.  No signs of lactic acidosis.  No electrolyte abnormalities.  Urinalysis  does suggest urinary tract infection [JK]    Clinical Course User Index [JK] Linwood Dibbles, MD                           Medical Decision Making Problems Addressed: Pyelonephritis: acute illness or injury that poses a threat to life or bodily functions  Amount and/or Complexity of Data Reviewed Labs: ordered. Decision-making details documented in ED Course. Radiology: ordered and independent interpretation performed. Decision-making details documented in ED Course.  Risk Prescription drug management.   Patient presented to the ED for evaluation of pyelonephritis.  Patient's had urinary symptoms and fevers at home.  She went to an urgent care and was diagnosed with pyelonephritis.  Patient was told she would start feeling better in a few hours after the antibiotics.  Patient was not feeling any better and she noted her heart was racing so she came to the ED for evaluation.  Was concerned about the possibility of sepsis with her tachycardia.  Fortunately lactic acid level is normal.  Patient was given IV fluids and her heart rate has decreased down to 100 at the  bedside.  Patient's not had any vomiting.  No signs of systemic infection.  CT scan was performed and there is no evidence of ureteral stone.  She does have findings that suggest pyelonephritis.  Patient was given a dose of Rocephin.  She already has antibiotics for ciprofloxacin and Zofran and Pyridium.  Patient also has a prescription for doxycycline, I am not sure what that is for specifically.  Patient improved with IV hydration.  Discussed with her that it will most likely take a couple of days before she starts feeling better.  She should return for vomiting, severe pain or other concerning symptoms.  Evaluation and diagnostic testing in the emergency department does not suggest an emergent condition requiring admission or immediate intervention beyond what has been performed at this time.  The patient is safe for discharge and has been instructed to return immediately for worsening symptoms, change in symptoms or any other concerns.          Final Clinical Impression(s) / ED Diagnoses Final diagnoses:  Pyelonephritis    Rx / DC Orders ED Discharge Orders     None         Linwood Dibbles, MD 06/20/22 2321

## 2022-06-22 LAB — URINE CULTURE: Culture: NO GROWTH

## 2023-01-20 ENCOUNTER — Encounter: Payer: Self-pay | Admitting: Vascular Surgery

## 2023-01-20 ENCOUNTER — Ambulatory Visit: Payer: No Typology Code available for payment source | Admitting: Vascular Surgery

## 2023-01-20 VITALS — BP 97/59 | HR 78 | Temp 99.2°F | Resp 18 | Ht 60.5 in | Wt 107.8 lb

## 2023-01-20 DIAGNOSIS — M25561 Pain in right knee: Secondary | ICD-10-CM | POA: Diagnosis not present

## 2023-01-20 NOTE — Progress Notes (Unsigned)
ASSESSMENT & PLAN   TELANGIECTASIAS BILATERAL LOWER EXTREMITIES: This patient has mild venous disease.  She has telangiectasias of both lower extremities but no varicose veins and no evidence of significant superficial venous reflux by duplex.  We have discussed importance of intermittent leg elevation and the proper positioning for this.  I encouraged her to continue to exercise.  We also discussed use of compression stockings.  I encouraged her to avoid prolonged sitting and standing.  I reassured her that she has excellent arterial flow.  PARESTHESIAS RIGHT FOOT: She does get some paresthesias in her right foot after running.  She has had no previous history of plantar fasciitis or foot problems.  I have recommended that she be evaluated by podiatry.    REASON FOR CONSULT:    Varicose veins right leg.  The consult is requested by Lawson Radar, PA.   HPI:   Michelle Peters is a 27 y.o. female who presents with some small spider veins of both lower extremities.  She denies significant aching pain or heaviness in her legs.  She does complain of some paresthesias in the right foot after running.  She denies any previous history of DVT.  She has had no venous procedures before.  Past Medical History:  Diagnosis Date   Seizures ([place])     History reviewed. No pertinent family history.  SOCIAL HISTORY: Social History   Tobacco Use   Smoking status: Never   Smokeless tobacco: Never  Substance Use Topics   Alcohol use: Yes    Comment: Seldom    No Known Allergies  Current Outpatient Medications  Medication Sig Dispense Refill   levETIRAcetam (KEPPRA XR) 500 MG 24 hr tablet Take 1 tablet every night 30 tablet 11   No current facility-administered medications for this visit.    REVIEW OF SYSTEMS:  '[X]'$  denotes positive finding, '[ ]'$  denotes negative finding Cardiac  Comments:  Chest pain or chest pressure:     Shortness of breath upon exertion:    Short of breath when lying  flat:    Irregular heart rhythm:        Vascular    Pain in calf, thigh, or hip brought on by ambulation:    Pain in feet at night that wakes you up from your sleep:     Blood clot in your veins:    Leg swelling:         Pulmonary    Oxygen at home:    Productive cough:     Wheezing:         Neurologic    Sudden weakness in arms or legs:     Sudden numbness in arms or legs:     Sudden onset of difficulty speaking or slurred speech:    Temporary loss of vision in one eye:     Problems with dizziness:         Gastrointestinal    Blood in stool:     Vomited blood:         Genitourinary    Burning when urinating:     Blood in urine:        Psychiatric    Major depression:         Hematologic    Bleeding problems:    Problems with blood clotting too easily:        Skin    Rashes or ulcers:        Constitutional    Fever or chills:    -  PHYSICAL EXAM:   Vitals:   01/20/23 1120  BP: (!) 97/59  Pulse: 78  Resp: 18  Temp: 99.2 F (37.3 C)  TempSrc: Temporal  SpO2: 99%  Weight: 107 lb 12.8 oz (48.9 kg)  Height: 5' 0.5" (1.537 m)   Body mass index is 20.71 kg/m. GENERAL: The patient is a well-nourished female, in no acute distress. The vital signs are documented above. CARDIAC: There is a regular rate and rhythm.  VASCULAR: I do not detect carotid bruits. She has a palpable dorsalis pedis and posterior tibial pulse bilaterally. She has some small telangiectasias and reticular veins bilaterally.       PULMONARY: There is good air exchange bilaterally without wheezing or rales. ABDOMEN: Soft and non-tender with normal pitched bowel sounds.  MUSCULOSKELETAL: There are no major deformities. NEUROLOGIC: No focal weakness or paresthesias are detected. SKIN: There are no ulcers or rashes noted. PSYCHIATRIC: The patient has a normal affect.  DATA:    She did not have formal venous reflux testing today.  I looked at both great saphenous veins myself with  the SonoSite and both veins were very small with no evidence of significant reflux.  Deitra Mayo [facility]

## 2023-03-02 ENCOUNTER — Ambulatory Visit: Payer: Self-pay | Admitting: Physician Assistant

## 2023-08-25 ENCOUNTER — Ambulatory Visit: Payer: Medicaid Other | Admitting: Physician Assistant

## 2023-08-25 ENCOUNTER — Encounter: Payer: Self-pay | Admitting: Physician Assistant

## 2023-08-25 VITALS — BP 100/70 | HR 77 | Temp 98.4°F | Ht 60.5 in | Wt 101.6 lb

## 2023-08-25 DIAGNOSIS — Z3009 Encounter for other general counseling and advice on contraception: Secondary | ICD-10-CM

## 2023-08-25 DIAGNOSIS — R569 Unspecified convulsions: Secondary | ICD-10-CM | POA: Diagnosis not present

## 2023-08-25 MED ORDER — LEVETIRACETAM 250 MG PO TABS: 250.0000 mg | ORAL_TABLET | Freq: Two times a day (BID) | ORAL | 2 refills | Status: DC

## 2023-08-25 NOTE — Progress Notes (Signed)
Subjective:    Patient ID: Michelle Peters, female    DOB: 10-15-1996, 27 y.o.   MRN: [id number]  Chief Complaint  Patient presents with   New Patient (Initial Visit)    New Pt in office to establish care with PCP; pt has seizures and needs her medication refilled; Pt also wants to discuss getting IUD placed; seen a Neurologist in [place] from [facility] but not seen in a while, doesn't remember who it was. Seizures started three years ago and last seizure was  today.     HPI 27 y.o. patient presents today for new patient establishment with me.  Patient was previously established with Dr. Yetta Flock.  Current Care Team: None currently    Acute Concerns: "Full-blown seizures" - only at night, maybe 30 in her lifetime the last 3 years.   "Absent seizures" - during the day   Needs seizure meds refilled; had a lapse in insurance and she had to cut her dose in half, ran out completely about 2 weeks ago. She has had a seizure daily since being completely   Past Medical History:  Diagnosis Date   Seizures ([facility])     Past Surgical History:  Procedure Laterality Date   HAND SURGERY Right    age 44   repair of fractured right ring finger    Family History  Problem Relation Age of Onset   Drug abuse Mother    Healthy Father    Diabetes type II Maternal Grandmother    Lung cancer Maternal Grandfather     Social History   Tobacco Use   Smoking status: Never   Smokeless tobacco: Never  Vaping Use   Vaping status: Never Used  Substance Use Topics   Alcohol use: Yes    Comment: Seldom   Drug use: Not Currently     Allergies  Allergen Reactions   Bee Venom Swelling and Rash    Review of Systems NEGATIVE UNLESS OTHERWISE INDICATED IN HPI      Objective:     BP 100/70 (BP Location: Left Arm, Patient Position: Sitting)   Pulse 77   Temp 98.4 F (36.9 C) (Temporal)   Ht 5' 0.5" (1.537 m)   Wt 101 lb 9.6 oz (46.1 kg)   LMP 08/18/2023 (Exact Date)   SpO2 96%   BMI  19.52 kg/m   Wt Readings from Last 3 Encounters:  08/25/23 101 lb 9.6 oz (46.1 kg)  01/20/23 107 lb 12.8 oz (48.9 kg)  06/20/22 117 lb 15.1 oz (53.5 kg)    BP Readings from Last 3 Encounters:  08/25/23 100/70  01/20/23 (!) 97/59  06/20/22 98/72     Physical Exam Vitals and nursing note reviewed.  Constitutional:      General: She is not in acute distress.    Appearance: Normal appearance. She is not ill-appearing.  Cardiovascular:     Rate and Rhythm: Normal rate and regular rhythm.     Pulses: Normal pulses.     Heart sounds: No murmur heard. Pulmonary:     Effort: Pulmonary effort is normal.     Breath sounds: Normal breath sounds.  Neurological:     General: No focal deficit present.     Mental Status: She is alert and oriented to person, place, and time.     Cranial Nerves: No cranial nerve deficit.     Motor: No weakness.     Gait: Gait normal.  Psychiatric:        Mood and Affect:  Mood normal.        Behavior: Behavior normal.        Assessment & Plan:  Seizures [facility]) Assessment & Plan: Since 08/2020 Previously controlled with Keppra, ran out of meds 2 weeks ago; pt reports absent seizures daily in this time, last seizure today. Completely back to baseline now. Will start her back on Keppra as she was prescribed. Referral to neurology. No driving x 6 months.  ED if any acute issues such as worst HA of life, weakness on one side, grand-mal activity, etc. Pt agreeable and understanding.   Orders: -     Ambulatory referral to Neurology -     levETIRAcetam; Take 1 tablet (250 mg total) by mouth 2 (two) times daily.  Dispense: 60 tablet; Refill: 2  Counseling for birth control regarding intrauterine device (IUD) -     Ambulatory referral to Gynecology       Return in about 5 months (around 01/23/2024) for physical, fasting labs .    [place] [place] [facility] Akash Winski, PA-C

## 2023-08-25 NOTE — Patient Instructions (Signed)
Start back on your Keppra twice daily dosing - Rx sent for you  No driving x 6 months since last seizure  Referrals to GYN and neurology placed - they should call or MyChart you to schedule.  Let me know if you don't hear back from them in the next few weeks.  Take care! Call if concerns.

## 2023-08-25 NOTE — Assessment & Plan Note (Addendum)
Since 08/2020 Previously controlled with Keppra, ran out of meds 2 weeks ago; pt reports absent seizures daily in this time, last seizure today. Completely back to baseline now. Will start her back on Keppra as she was prescribed. Referral to neurology. No driving x 6 months.  ED if any acute issues such as worst HA of life, weakness on one side, grand-mal activity, etc. Pt agreeable and understanding.

## 2023-09-29 ENCOUNTER — Ambulatory Visit: Payer: Medicaid Other | Admitting: Neurology

## 2023-09-29 ENCOUNTER — Encounter: Payer: Self-pay | Admitting: Neurology

## 2023-09-29 VITALS — BP 103/68 | HR 92 | Ht 59.0 in | Wt 103.8 lb

## 2023-09-29 DIAGNOSIS — G40209 Localization-related (focal) (partial) symptomatic epilepsy and epileptic syndromes with complex partial seizures, not intractable, without status epilepticus: Secondary | ICD-10-CM

## 2023-09-29 MED ORDER — LEVETIRACETAM ER 500 MG PO TB24
ORAL_TABLET | ORAL | 6 refills | Status: DC
Start: 2023-09-29 — End: 2024-01-03

## 2023-09-29 NOTE — Patient Instructions (Signed)
Good to see you.  Restart Keppra XR (extended release) 500mg : take 1 tablet every night. Let me know if cost prohibitive so we can send prescription to [facility]  2. Continue seizure calendar  3. Follow-up in 3 months, call for any changes   Seizure Precautions: 1. If medication has been prescribed for you to prevent seizures, take it exactly as directed.  Do not stop taking the medicine without talking to your doctor first, even if you have not had a seizure in a long time.   2. Avoid activities in which a seizure would cause danger to yourself or to others.  Don't operate dangerous machinery, swim alone, or climb in high or dangerous places, such as on ladders, roofs, or girders.  Do not drive unless your doctor says you may.  3. If you have any warning that you may have a seizure, lay down in a safe place where you can't hurt yourself.    4.  No driving for 6 months from last seizure, as per [facility].   Please refer to the following link on the [employer]'s website for more information: http://www.epilepsyfoundation.org/answerplace/Social/driving/drivingu.cfm   5.  Maintain good sleep hygiene. Avoid alcohol.  6.  Notify your neurology if you are planning pregnancy or if you become pregnant.  7.  Contact your doctor if you have any problems that may be related to the medicine you are taking.  8.  Call 911 and bring the patient back to the ED if:        A.  The seizure lasts longer than 5 minutes.       B.  The patient doesn't awaken shortly after the seizure  C.  The patient has new problems such as difficulty seeing, speaking or moving  D.  The patient was injured during the seizure  E.  The patient has a temperature over 102 F (39C)  F.  The patient vomited and now is having trouble breathing

## 2023-09-29 NOTE — Progress Notes (Signed)
NEUROLOGY FOLLOW UP OFFICE NOTE  Michelle Peters 1996-02-15  HISTORY OF PRESENT ILLNESS: I had the pleasure of seeing Michelle Peters in follow-up in the neurology clinic on 09/29/2023. She is alone in the office today. The patient was last seen in 12/2020 for nocturnal seizures. She was lost to follow-up and presents today for seizure recurrence. Records and images were personally reviewed where available.  I personally reviewed brain MRI with and without contrast 01/2021 which did not show any acute changes. There was note of incomplete left hippocampal inversion. MRA head normal. Her 1-hour EEG in 12/2020 was normal. She was advised to start Levetiracetam and called our office in 01/2021 agreeing to start medication after another nocturnal seizure, feeling weird the next day. She presents today reporting she had a lapse in insurance and initially cut dose in half then ran out of medication in 07/2023. She presented to her new PCP on 08/25/23 reporting seizures occurring daily since being off medication. She recalls in January 2021 she was having 10 a day where she would be told she is staring/unresponsive, saying "what," amnestic of events. She would sleep fro 5 hours after. Her boss would see her sitting for 5 minutes staring off, get up to use the bathroom after. She would be exhausted after, no focal weakness, tongue bite or incontinence. PCP restarted Levetiracetam 250mg  BID and she states she was having maybe one seizure a month on medication. Around 3 months ago, she had a nocturnal convulsion that she was unaware of, her boyfriend told her about it the next morning. She felt fine that morning. A week later she had another one and felt awful after, the week was a blur where she could not recall what she did. She had run out of medication at this time. She has not been on medications and states last nocturnal seizure was last week where she bit her cheeks badly. Last focal seizure was around a week  ago.  She denies any  olfactory/gustatory hallucinations, focal numbness/tingling/weakness, myoclonic jerks. No headaches, dizziness, vision changes, no falls. She reports some memory changes, she has a 26 year old and cannot recall the first 2 years of her daughter's life. She lives with her daughter and boyfriend. She has an IUD, no pregnancy plans.    History on Initial Assessment 01/01/2021: This is a 26 year old right-handed woman with a history of remote migraines presenting for evaluation of recurrent nocturnal seizures. The first seizure occurred in October, she does not remember being in the hospital [facility] - records not available for review) and took  2 weeks to feel normal. She felt "dazed, just not there." She is a big reader and likes to do Sudoku and would sit for hours not doing anything. She had another nocturnal seizure on Thanksgiving night and most recently on 10/31/20. They occur between 3-4AM lasting 2 minutes per husband. No tongue bite or incontinence. She denies any change in sleep patterns. She drinks 1 energy drink a day since age 82 which she would drink throughout the day. She had wine Thanksgiving night. She has been told she would tell her husband things she had already told him. Her sister has noticed she would forget they had just talked on the phone. She denies any staring/unresponsive episodes, olfactory/gustatory hallucinations, deja vu, rising epigastric sensation, focal numbness/tingling/weakness, myoclonic jerks. She had a pressure over her right eye 2 weeks ago, making her vision funny/blurred. This lasted for a week, she did not take any medication.  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 She was having these episodes once a week until she became pregnant and has not had a single migraine since then. She has noticed an increase in epistaxis, she has had 3 recently. She has dizziness  where she would be sitting and feel a sensation of movement for 30 seconds, sometimes occurring three times a day, no associated confusion. She has back pain, no bowel/bladder dysfunction. She had 5 concussions in high school while playing hockey, head injury during skiing, no neurosurgical procedures. A maternal cousin had seizures in childhood. Her paternal grandfather had a brain aneurysm. She had a normal birth and early development.  There is no history of febrile convulsions, CNS infections such as meningitis/encephalitis, significant traumatic brain injury.   PAST MEDICAL HISTORY: Past Medical History:  Diagnosis Date   Seizures (HCC)     MEDICATIONS: Current Outpatient Medications on File Prior to Visit  Medication Sig Dispense Refill   levETIRAcetam (KEPPRA) 250 MG tablet Take 1 tablet (250 mg total) by mouth 2 (two) times daily. 60 tablet 2   No current facility-administered medications on file prior to visit.    ALLERGIES: Allergies  Allergen Reactions   Bee Venom Swelling and Rash    FAMILY HISTORY: Family History  Problem Relation Age of Onset   Drug abuse Mother    Healthy Father    Diabetes type II Maternal Grandmother    Lung cancer Maternal Grandfather     SOCIAL HISTORY: Social History   Socioeconomic History   Marital status: Single    Spouse name: Not on file   Number of children: 1   Years of education: 13   Highest education level: Not on file  Occupational History   Occupation: home maker  Tobacco Use   Smoking status: Never   Smokeless tobacco: Never  Vaping Use   Vaping status: Never Used  Substance and Sexual Activity   Alcohol use: Yes    Comment: Seldom   Drug use: Not Currently   Sexual activity: Yes    Birth control/protection: Condom  Other Topics Concern   Not on file  Social History Narrative   Right handed   Drinks caffeine   Two story home   Social Determinants of Health   Financial Resource Strain: Not on file  Food  Insecurity: Not on file  Transportation Needs: Not on file  Physical Activity: Not on file  Stress: Not on file  Social Connections: Not on file  Intimate Partner Violence: Not on file     PHYSICAL EXAM: Vitals:   09/29/23 1318  BP: 103/68  Pulse: 92  SpO2: 99%   General: No acute distress Head:  Normocephalic/atraumatic Skin/Extremities: No rash, no edema Neurological Exam: alert and awake. No aphasia or dysarthria. Fund of knowledge is appropriate.  Attention and concentration are normal.   Cranial nerves: Pupils equal, round. Extraocular movements intact with no nystagmus. Visual fields full.  No facial asymmetry.  Motor: Bulk and tone normal, muscle strength 5/5 throughout with no pronator drift.   Finger to nose testing intact.  Gait narrow-based and steady, able to tandem walk adequately.  Romberg negative.   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 Brain MRI no acute changes, there is incomplete left hippocampal inversion. EEG normal. She  has been off seizure medication with last GTC and focal seizures occurring a week ago. We discussed the diagnosis, prognosis, and management of epilepsy. We discussed the importance of medication compliance, she is agreeable to restart Levetiracetam ER 500mg  at bedtime. Low threshold to increase dose, she knows to call for any changes. Continue seizure calendar. No pregnancy plans. She is aware of [place] driving laws to stop driving after a seizure until 6 months seizure-free. Follow-up in 3 months,call for any changes.    Thank you for allowing me to participate in her care.  Please do not hesitate to call for any questions or concerns.    Michelle Peters, M.D.   CCArlyss Repress Allwardt, PA-C

## 2023-11-26 ENCOUNTER — Other Ambulatory Visit: Payer: Self-pay | Admitting: Physician Assistant

## 2023-11-26 DIAGNOSIS — R569 Unspecified convulsions: Secondary | ICD-10-CM

## 2023-11-26 NOTE — Telephone Encounter (Signed)
 Pt medication was discontinued by another office; please advise on refill

## 2024-01-03 ENCOUNTER — Ambulatory Visit: Payer: Medicaid Other | Admitting: Neurology

## 2024-01-03 ENCOUNTER — Encounter: Payer: Self-pay | Admitting: Neurology

## 2024-01-03 VITALS — BP 108/70 | HR 72 | Ht 59.0 in | Wt 109.0 lb

## 2024-01-03 DIAGNOSIS — G40209 Localization-related (focal) (partial) symptomatic epilepsy and epileptic syndromes with complex partial seizures, not intractable, without status epilepticus: Secondary | ICD-10-CM

## 2024-01-03 MED ORDER — LEVETIRACETAM ER 500 MG PO TB24: ORAL_TABLET | ORAL | 6 refills | Status: DC

## 2024-01-03 NOTE — Patient Instructions (Signed)
 Good to see you.  Increase Levetiracetam  ER 500mg : take 2 tablets every night.  Continue seizure calendar and update me in 2 weeks. We may increase dose depending on how you are feeling  2. Follow-up in 3 months, call for any changes   Seizure Precautions: 1. If medication has been prescribed for you to prevent seizures, take it exactly as directed.  Do not stop taking the medicine without talking to your doctor first, even if you have not had a seizure in a long time.   2. Avoid activities in which a seizure would cause danger to yourself or to others.  Don't operate dangerous machinery, swim alone, or climb in high or dangerous places, such as on ladders, roofs, or girders.  Do not drive unless your doctor says you may.  3. If you have any warning that you may have a seizure, lay down in a safe place where you can't hurt yourself.    4.  No driving for 6 months from last seizure, as per [place]  state law.   Please refer to the following link on the [employer]ile[employer]'s website for more information: http://www.epilepsyfoundation.org/answerplace/Social/driving/drivingu.cfm   5.  Maintain good sleep hygiene. Avoid alcohol.  6.  Notify your neurology if you are planning pregnancy or if you become pregnant.  7.  Contact your doctor if you have any problems that may be related to the medicine you are taking.  8.  Call 911 and bring the patient back to the ED if:        A.  The seizure lasts longer than 5 minutes.       B.  The patient doesn't awaken shortly after the seizure  C.  The patient has new problems such as difficulty seeing, speaking or moving  D.  The patient was injured during the seizure  E.  The patient has a temperature over 102 F (39C)  F.  The patient vomited and now is having trouble breathing

## 2024-01-03 NOTE — Progress Notes (Signed)
 NEUROLOGY FOLLOW UP OFFICE NOTE  Michelle Peters [id number] 1996-01-26  HISTORY OF PRESENT ILLNESS: I had the pleasure of seeing Michelle Peters in follow-up in the neurology clinic on 01/03/2024.  The patient was last seen 3 months ago for focal to bilateral tonic-clonic epilepsy, possibly left temporal. EEG normal. MRI brain no acute changes, there was note of incomplete left hippocampal inversion. She started low dose Levetiracetam  ER 500mg  at bedtime on last visit. She denies any side effects. She reports an average of one nocturnal seizure a month, where she is told she vocalizes/screams, she wakes up tired. She bit her tongue with a seizure around Christmas. Last nocturnal seizure was a couple of weeks ago. She also continues to be told she is staring a couple of times a week, especially at work. She is amnestic of them. She has tasted blood randomly, noticing it more when she is cleaning. No associated symptoms. She has a contentious relationship with her daughter's father, she gets upsetting text messages and when she shares it with a friend, she gets lightheaded, shaky, and feels confused.    History on Initial Assessment 01/01/2021: This is a 28 year old right-handed woman with a history of remote migraines presenting for evaluation of recurrent nocturnal seizures. The first seizure occurred in October, she does not remember being in the hospital [facility] - records not available for review) and took  2 weeks to feel normal. She felt "dazed, just not there." She is a big reader and likes to do Sudoku and would sit for hours not doing anything. She had another nocturnal seizure on Thanksgiving night and most recently on 10/31/20. They occur between 3-4AM lasting 2 minutes per husband. No tongue bite or incontinence. She denies any change in sleep patterns. She drinks 1 energy drink a day since age 22 which she would drink throughout the day. She had wine Thanksgiving night. She has been told she would tell  her husband things she had already told him. Her sister has noticed she would forget they had just talked on the phone. She denies any staring/unresponsive episodes, olfactory/gustatory hallucinations, deja vu, rising epigastric sensation, focal numbness/tingling/weakness, myoclonic jerks. She had a pressure over her right eye 2 weeks ago, making her vision funny/blurred. This lasted for a week, she did not take any medication. 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 She was having these episodes once a week until she became pregnant and has not had a single migraine since then. She has noticed an increase in epistaxis, she has had 3 recently. She has dizziness where she would be sitting and feel a sensation of movement for 30 seconds, sometimes occurring three times a day, no associated confusion. She has back pain, no bowel/bladder dysfunction. She had 5 concussions in high school while playing hockey, head injury during skiing, no neurosurgical procedures. A maternal cousin had seizures in childhood. Her paternal grandfather had a brain aneurysm. She had a normal birth and early development.  There is no history of febrile convulsions, CNS infections such as meningitis/encephalitis, significant traumatic brain injury.  Diagnostic Data: MRI brain with and without contrast 01/2021: No acute changes, incomplete left hippocampal inversion 1-hour wake and sleep EEG in 12/2020 normal.    PAST MEDICAL HISTORY: Past Medical History:  Diagnosis Date   Seizures (HCC)     MEDICATIONS: Current Outpatient Medications on File Prior to Visit  Medication Sig  Dispense Refill   levETIRAcetam  (KEPPRA  XR) 500 MG 24 hr tablet Take 1 tablet every night 30 tablet 6   No current facility-administered medications on file prior to visit.    ALLERGIES: Allergies  Allergen Reactions   Bee Venom Swelling and Rash    FAMILY  HISTORY: Family History  Problem Relation Age of Onset   Drug abuse Mother    Healthy Father    Diabetes type II Maternal Grandmother    Lung cancer Maternal Grandfather     SOCIAL HISTORY: Social History   Socioeconomic History   Marital status: Single    Spouse name: Not on file   Number of children: 1   Years of education: 13   Highest education level: Not on file  Occupational History   Occupation: home maker  Tobacco Use   Smoking status: Never   Smokeless tobacco: Never  Vaping Use   Vaping status: Never Used  Substance and Sexual Activity   Alcohol use: Yes    Comment: Seldom   Drug use: Not Currently   Sexual activity: Yes    Birth control/protection: Condom  Other Topics Concern   Not on file  Social History Narrative   Right handed   Drinks caffeine   Two story home   Social Drivers of Health   Financial Resource Strain: Not on file  Food Insecurity: Not on file  Transportation Needs: Not on file  Physical Activity: Not on file  Stress: Not on file  Social Connections: Not on file  Intimate Partner Violence: Not on file     PHYSICAL EXAM: Vitals:   01/03/24 1522  BP: 108/70  Pulse: 72  SpO2: 98%   General: No acute distress Head:  Normocephalic/atraumatic Skin/Extremities: No rash, no edema Neurological Exam: alert and awake. No aphasia or dysarthria. Fund of knowledge is appropriate.  Attention and concentration are normal.   Cranial nerves: Pupils equal, round. Extraocular movements intact with no nystagmus. Visual fields full.  No facial asymmetry.  Motor: Bulk and tone normal, muscle strength 5/5 throughout with no pronator drift.   Finger to nose testing intact.  Gait narrow-based and steady, able to tandem walk adequately.  Romberg negative.   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 Brain MRI no acute changes, there  is incomplete left hippocampal inversion. EEG normal. She continues to have around one nocturnal convulsion a month and several focal seizures a week. Increase Levetiracetam  ER 500mg : take 2 tablets every night. She will update in 2 weeks, we may increase dose depending on response. Continue seizure calendar. Discussed effects of stress on seizures, she has not felt therapy to be helpful in the past. She is aware of [place] driving laws to stop driving after a seizure until 6 months seizure-free. Follow-up in 3 months, call for any changes.    Thank you for allowing me to participate in her care.  Please do not hesitate to call for any questions or concerns.    Rayfield Cairo, M.D.   CCJaneice Medal Allwardt, PA-C

## 2024-01-25 ENCOUNTER — Encounter: Payer: Self-pay | Admitting: Physician Assistant

## 2024-01-25 ENCOUNTER — Other Ambulatory Visit (HOSPITAL_COMMUNITY)
Admission: RE | Admit: 2024-01-25 | Discharge: 2024-01-25 | Disposition: A | Discharge status: Home / Self Care | Source: Ambulatory Visit | Attending: Physician Assistant | Admitting: Physician Assistant

## 2024-01-25 ENCOUNTER — Ambulatory Visit: Payer: Medicaid Other | Admitting: Physician Assistant

## 2024-01-25 VITALS — BP 102/68 | HR 80 | Temp 98.2°F | Ht 59.0 in | Wt 107.0 lb

## 2024-01-25 DIAGNOSIS — Z124 Encounter for screening for malignant neoplasm of cervix: Secondary | ICD-10-CM | POA: Insufficient documentation

## 2024-01-25 DIAGNOSIS — Z0000 Encounter for general adult medical examination without abnormal findings: Secondary | ICD-10-CM

## 2024-01-25 DIAGNOSIS — Z01419 Encounter for gynecological examination (general) (routine) without abnormal findings: Secondary | ICD-10-CM | POA: Diagnosis not present

## 2024-01-25 DIAGNOSIS — Z13220 Encounter for screening for lipoid disorders: Secondary | ICD-10-CM

## 2024-01-25 LAB — COMPREHENSIVE METABOLIC PANEL
ALT: 18 U/L (ref 0–35)
AST: 34 U/L (ref 0–37)
Albumin: 4.5 g/dL (ref 3.5–5.2)
Alkaline Phosphatase: 36 U/L — ABNORMAL LOW (ref 39–117)
BUN: 13 mg/dL (ref 6–23)
CO2: 29 meq/L (ref 19–32)
Calcium: 9.4 mg/dL (ref 8.4–10.5)
Chloride: 103 meq/L (ref 96–112)
Creatinine, Ser: 0.68 mg/dL (ref 0.40–1.20)
GFR: 119.64 mL/min (ref >60.00)
Glucose, Bld: 86 mg/dL (ref 70–99)
Potassium: 4.1 meq/L (ref 3.5–5.1)
Sodium: 139 meq/L (ref 135–145)
Total Bilirubin: 1.6 mg/dL — ABNORMAL HIGH (ref 0.2–1.2)
Total Protein: 6.9 g/dL (ref 6.0–8.3)

## 2024-01-25 LAB — CBC WITH DIFFERENTIAL/PLATELET
Basophils Absolute: 0 10*3/uL (ref 0.0–0.1)
Basophils Relative: 0.9 % (ref 0.0–3.0)
Eosinophils Absolute: 0.1 10*3/uL (ref 0.0–0.7)
Eosinophils Relative: 2.6 % (ref 0.0–5.0)
HCT: 42.7 % (ref 36.0–46.0)
Hemoglobin: 14.3 g/dL (ref 12.0–15.0)
Lymphocytes Relative: 27.2 % (ref 12.0–46.0)
Lymphs Abs: 1.2 10*3/uL (ref 0.7–4.0)
MCHC: 33.5 g/dL (ref 30.0–36.0)
MCV: 92.7 fl (ref 78.0–100.0)
Monocytes Absolute: 0.2 10*3/uL (ref 0.1–1.0)
Monocytes Relative: 5.1 % (ref 3.0–12.0)
Neutro Abs: 2.8 10*3/uL (ref 1.4–7.7)
Neutrophils Relative %: 64.2 % (ref 43.0–77.0)
Platelets: 200 10*3/uL (ref 150.0–400.0)
RBC: 4.6 Mil/uL (ref 3.87–5.11)
RDW: 13.5 % (ref 11.5–15.5)
WBC: 4.4 10*3/uL (ref 4.0–10.5)

## 2024-01-25 LAB — LIPID PANEL
Cholesterol: 119 mg/dL (ref 0–200)
HDL: 63.8 mg/dL (ref >39.00)
LDL Cholesterol: 44 mg/dL (ref 0–99)
NonHDL: 55.18
Total CHOL/HDL Ratio: 2
Triglycerides: 57 mg/dL (ref 0.0–149.0)
VLDL: 11.4 mg/dL (ref 0.0–40.0)

## 2024-01-25 LAB — TSH: TSH: 0.86 u[IU]/mL (ref 0.35–5.50)

## 2024-01-25 LAB — VITAMIN B12: Vitamin B-12: 522 pg/mL (ref 211–911)

## 2024-01-25 LAB — VITAMIN D 25 HYDROXY (VIT D DEFICIENCY, FRACTURES): VITD: 15.29 ng/mL — ABNORMAL LOW (ref 30.00–100.00)

## 2024-01-25 NOTE — Progress Notes (Signed)
 Patient ID: Michelle Peters, female    DOB: [birth year], 28 y.o.   MRN: [id number]   Assessment & Plan:  Cervical cancer screening -     Cytology - PAP  Well woman exam with routine gynecological exam -     CBC with Differential/Platelet -     Comprehensive metabolic panel -     Lipid panel -     TSH -     Vitamin B12 -     VITAMIN D 25 Hydroxy (Vit-D Deficiency, Fractures)   Assessment and Plan    Well Woman Exam Discussed Pap smear's role in HPV detection and cervical cancer prevention. Explained normal results would extend interval to 3-5 years. - Perform Pap smear. - Order laboratory tests.  ??Family History of Breast Cancer Family history of breast cancer in mother and aunt. Patient skeptical of accuracy. Prefers to delay early screening.  - Continue dialogue about early breast cancer screening in the future.  General Health Maintenance Encouraged healthy lifestyle. Provided health guidelines. Preventive care up to date. - Encourage exercise and healthy diet. - Provide age-appropriate health guidelines.      Age-appropriate screening and counseling performed today. Will check labs and call with results. Preventive measures discussed and printed in AVS for patient.   Patient Counseling: [x]   Nutrition: Stressed importance of moderation in sodium/caffeine intake, saturated fat and cholesterol, caloric balance, sufficient intake of fresh fruits, vegetables, and fiber.  [x]   Stressed the importance of regular exercise.   [x]   Substance Abuse: Discussed cessation/primary prevention of tobacco, alcohol, or other drug use; driving or other dangerous activities under the influence; availability of treatment for abuse.   []   Injury prevention: Discussed safety belts, safety helmets, smoke detector, smoking near bedding or upholstery.   []   Sexuality: Discussed sexually transmitted diseases, partner selection, use of condoms, avoidance of unintended pregnancy  and contraceptive  alternatives.   [x]   Dental health: Discussed importance of regular tooth brushing, flossing, and dental visits.  [x]   Health maintenance and immunizations reviewed. Please refer to Health maintenance section.          Return in about 6 months (around 07/27/2024) for recheck/follow-up.    Subjective:    Chief Complaint  Patient presents with   Annual Exam    Pt in office for annual CPE, fasting labs and Pap Smear;     HPI   History of Present Illness   Michelle Peters is a 28 year old female who presents for a well woman annual physical exam, Pap smear, and labs.  She is unsure if she has ever had a Pap smear before, possibly during her pregnancy five years ago, but there are no records available. She experiences anxiety about the procedure but was able to complete it.  She had an IUD placed approximately six months ago. Previously, after the birth of her daughter, she had a Mirena IUD which limited her period to three days. However, with the current IUD, her periods last about two weeks, which she describes as 'miserable'.  There is a family history of breast cancer, with her mother and aunt affected. Her mother was diagnosed around age 7, but she is skeptical about the truth of this due to her mother's history of drug abuse and her strained relationship. She has not pursued genetic counseling or early screening due to these uncertainties.  No skin concerns such as rashes or moles, only freckles. She mentions having small 'cherry spots' on her skin.  She  is currently going through a separation with her child's father.       Past Medical History:  Diagnosis Date   Seizures (HCC)     Past Surgical History:  Procedure Laterality Date   HAND SURGERY Right    age 12   repair of fractured right ring finger    Family History  Problem Relation Age of Onset   Drug abuse Mother    Healthy Father    Diabetes type II Maternal Grandmother    Lung cancer Maternal Grandfather      Social History   Tobacco Use   Smoking status: Never   Smokeless tobacco: Never  Vaping Use   Vaping status: Never Used  Substance Use Topics   Alcohol use: Yes    Comment: Seldom   Drug use: Not Currently     Allergies  Allergen Reactions   Bee Venom Swelling and Rash    Review of Systems NEGATIVE UNLESS OTHERWISE INDICATED IN HPI      Objective:     BP 102/68 (BP Location: Left Arm, Patient Position: Sitting, Cuff Size: Normal)   Pulse 80   Temp 98.2 F (36.8 C) (Temporal)   Ht 4\' 11"  (1.499 m)   Wt 107 lb (48.5 kg)   BMI 21.61 kg/m   Wt Readings from Last 3 Encounters:  01/25/24 107 lb (48.5 kg)  01/03/24 109 lb (49.4 kg)  09/29/23 103 lb 12.8 oz (47.1 kg)    BP Readings from Last 3 Encounters:  01/25/24 102/68  01/03/24 108/70  09/29/23 103/68     Physical Exam Vitals and nursing note reviewed. Exam conducted with a chaperone present.  Constitutional:      Appearance: Normal appearance. She is normal weight. She is not toxic-appearing.  HENT:     Head: Normocephalic and atraumatic.     Right Ear: Tympanic membrane, ear canal and external ear normal.     Left Ear: Tympanic membrane, ear canal and external ear normal.     Nose: Nose normal.     Mouth/Throat:     Mouth: Mucous membranes are moist.  Eyes:     Extraocular Movements: Extraocular movements intact.     Conjunctiva/sclera: Conjunctivae normal.     Pupils: Pupils are equal, round, and reactive to light.  Neck:     Thyroid: No thyroid mass, thyromegaly or thyroid tenderness.  Cardiovascular:     Rate and Rhythm: Normal rate and regular rhythm.     Pulses: Normal pulses.     Heart sounds: Normal heart sounds.  Pulmonary:     Effort: Pulmonary effort is normal.     Breath sounds: Normal breath sounds.  Chest:     Chest wall: No mass.  Breasts:    Right: Normal. No swelling, bleeding, inverted nipple, mass, nipple discharge, skin change or tenderness.     Left: Normal. No  swelling, bleeding, inverted nipple, mass, nipple discharge, skin change or tenderness.     Comments: Dense breast tissue bilaterally, no focal masses noted Abdominal:     General: Abdomen is flat. Bowel sounds are normal.     Palpations: Abdomen is soft.  Genitourinary:    General: Normal vulva.     Labia:        Right: No rash, tenderness or lesion.        Left: No rash, tenderness or lesion.      Vagina: Normal.     Cervix: Normal.     Uterus: Normal.  Adnexa: Right adnexa normal and left adnexa normal.     Comments: IUD strings present Musculoskeletal:        General: Normal range of motion.     Cervical back: Normal range of motion and neck supple.     Right lower leg: No edema.     Left lower leg: No edema.  Lymphadenopathy:     Cervical: No cervical adenopathy.     Upper Body:     Right upper body: No supraclavicular, axillary or pectoral adenopathy.     Left upper body: No supraclavicular, axillary or pectoral adenopathy.  Skin:    General: Skin is warm and dry.     Findings: No lesion.  Neurological:     General: No focal deficit present.     Mental Status: She is alert and oriented to person, place, and time.  Psychiatric:        Mood and Affect: Mood is anxious. Affect is tearful.        Behavior: Behavior normal.        Thought Content: Thought content normal.        Judgment: Judgment normal.       Prem Coykendall M Kaide Gage, PA-C

## 2024-01-27 LAB — CYTOLOGY - PAP
Chlamydia: NEGATIVE
Comment: NEGATIVE
Comment: NEGATIVE
Comment: NORMAL
Diagnosis: NEGATIVE
Neisseria Gonorrhea: NEGATIVE
Trichomonas: NEGATIVE

## 2024-01-30 ENCOUNTER — Encounter: Payer: Self-pay | Admitting: Physician Assistant

## 2024-01-31 ENCOUNTER — Other Ambulatory Visit: Payer: Self-pay

## 2024-01-31 MED ORDER — VITAMIN D (ERGOCALCIFEROL) 1.25 MG (50000 UNIT) PO CAPS: 50000.0000 [IU] | ORAL_CAPSULE | ORAL | 0 refills | Status: AC

## 2024-04-04 ENCOUNTER — Ambulatory Visit: Payer: Medicaid Other | Admitting: Neurology

## 2024-04-21 ENCOUNTER — Other Ambulatory Visit: Payer: Self-pay | Admitting: Physician Assistant

## 2024-06-06 ENCOUNTER — Emergency Department (HOSPITAL_BASED_OUTPATIENT_CLINIC_OR_DEPARTMENT_OTHER)
Admission: EM | Admit: 2024-06-06 | Discharge: 2024-06-06 | Disposition: A | Discharge status: Home / Self Care | Attending: Emergency Medicine | Admitting: Emergency Medicine

## 2024-06-06 ENCOUNTER — Emergency Department (HOSPITAL_BASED_OUTPATIENT_CLINIC_OR_DEPARTMENT_OTHER)

## 2024-06-06 ENCOUNTER — Emergency Department (HOSPITAL_BASED_OUTPATIENT_CLINIC_OR_DEPARTMENT_OTHER): Admitting: Radiology

## 2024-06-06 ENCOUNTER — Encounter (HOSPITAL_BASED_OUTPATIENT_CLINIC_OR_DEPARTMENT_OTHER): Payer: Self-pay | Admitting: Emergency Medicine

## 2024-06-06 ENCOUNTER — Other Ambulatory Visit: Payer: Self-pay

## 2024-06-06 DIAGNOSIS — Z79899 Other long term (current) drug therapy: Secondary | ICD-10-CM | POA: Diagnosis not present

## 2024-06-06 DIAGNOSIS — K2900 Acute gastritis without bleeding: Secondary | ICD-10-CM | POA: Diagnosis not present

## 2024-06-06 DIAGNOSIS — R1013 Epigastric pain: Secondary | ICD-10-CM | POA: Diagnosis present

## 2024-06-06 LAB — TROPONIN T, HIGH SENSITIVITY
Troponin T High Sensitivity: <15 ng/L (ref <19)
Troponin T High Sensitivity: <15 ng/L (ref <19)

## 2024-06-06 LAB — CBC
HCT: 42.2 % (ref 36.0–46.0)
Hemoglobin: 14.3 g/dL (ref 12.0–15.0)
MCH: 31 pg (ref 26.0–34.0)
MCHC: 33.9 g/dL (ref 30.0–36.0)
MCV: 91.3 fL (ref 80.0–100.0)
Platelets: 219 K/uL (ref 150–400)
RBC: 4.62 MIL/uL (ref 3.87–5.11)
RDW: 12.8 % (ref 11.5–15.5)
WBC: 8.2 K/uL (ref 4.0–10.5)
nRBC: 0 % (ref 0.0–0.2)

## 2024-06-06 LAB — COMPREHENSIVE METABOLIC PANEL WITH GFR
ALT: 12 U/L (ref 0–44)
AST: 21 U/L (ref 15–41)
Albumin: 4.8 g/dL (ref 3.5–5.0)
Alkaline Phosphatase: 53 U/L (ref 38–126)
Anion gap: 11 (ref 5–15)
BUN: 9 mg/dL (ref 6–20)
CO2: 24 mmol/L (ref 22–32)
Calcium: 9.9 mg/dL (ref 8.9–10.3)
Chloride: 104 mmol/L (ref 98–111)
Creatinine, Ser: 0.83 mg/dL (ref 0.44–1.00)
GFR, Estimated: >60 mL/min (ref >60)
Glucose, Bld: 105 mg/dL — ABNORMAL HIGH (ref 70–99)
Potassium: 4 mmol/L (ref 3.5–5.1)
Sodium: 139 mmol/L (ref 135–145)
Total Bilirubin: 0.8 mg/dL (ref 0.0–1.2)
Total Protein: 7.3 g/dL (ref 6.5–8.1)

## 2024-06-06 LAB — LIPASE, BLOOD: Lipase: 34 U/L (ref 11–51)

## 2024-06-06 LAB — PREGNANCY, URINE: Preg Test, Ur: NEGATIVE

## 2024-06-06 MED ORDER — IOHEXOL 300 MG/ML  SOLN
100.0000 mL | Freq: Once | INTRAMUSCULAR | Status: AC | PRN
  Administered 2024-06-06: 85 mL via INTRAVENOUS

## 2024-06-06 MED ORDER — PANTOPRAZOLE SODIUM 40 MG PO TBEC: 40.0000 mg | DELAYED_RELEASE_TABLET | Freq: Every day | ORAL | 1 refills | Status: AC

## 2024-06-06 NOTE — ED Triage Notes (Signed)
 Woke with nausea and epigastric pain  Constant Not relieved with pepto  Able to drink water

## 2024-06-06 NOTE — ED Provider Notes (Signed)
 [place] EMERGENCY DEPARTMENT AT [facility] Provider Note   CSN: [id number] Arrival date & time: 06/06/24  1649     Patient presents with: Abdominal Pain   Michelle Peters is a 28 y.o. female.   Patient complains of pain in the epigastric area that began this a.m.  Patient reports that the area is in the mid of her upper abdomen lower chest.  Patient reports she is experienced some nausea.  Patient denies any fever and chills.  Patient reports recent increased alcohol intake due to increased stress.  Patient denies any fever or chills she has not had any vomiting she denies any diarrhea patient has had normal bowel movements.  Patient has a past medical history of seizures.  The history is provided by the spouse. No language interpreter was used.  Abdominal Pain      Prior to Admission medications   Medication Sig Start Date End Date Taking? Authorizing Provider  pantoprazole  (PROTONIX ) 40 MG tablet Take 1 tablet (40 mg total) by mouth daily. 06/06/24 06/06/25 Yes Blaklee Shores K, PA-C  levETIRAcetam  (KEPPRA  XR) 500 MG 24 hr tablet Take 2 tablets every night 01/03/24   Aquino, Karen M, MD  levonorgestrel (MIRENA) 20 MCG/DAY IUD 1 each by Intrauterine route once.    [provider]  Vitamin D , Ergocalciferol , (DRISDOL ) 1.25 MG (50000 UNIT) CAPS capsule Take 1 capsule (50,000 Units total) by mouth every 7 (seven) days. 01/31/24   Allwardt, Alyssa M, PA-C    Allergies: Bee venom    Review of Systems  Gastrointestinal:  Positive for abdominal pain.  All other systems reviewed and are negative.   Updated Vital Signs BP 119/87   Pulse 92   Temp 98.6 F (37 C)   Resp 15   SpO2 100%   Physical Exam Vitals and nursing note reviewed.  Constitutional:      Appearance: She is well-developed.  HENT:     Head: Normocephalic.  Eyes:     Extraocular Movements: Extraocular movements intact.  Cardiovascular:     Rate and Rhythm: Normal rate and regular rhythm.   Pulmonary:     Effort: Pulmonary effort is normal.  Abdominal:     General: Bowel sounds are normal. There is no distension.     Palpations: Abdomen is soft.     Tenderness: There is abdominal tenderness in the epigastric area.  Musculoskeletal:        General: Normal range of motion.     Cervical back: Normal range of motion.  Skin:    General: Skin is warm.  Neurological:     General: No focal deficit present.     Mental Status: She is alert and oriented to person, place, and time.     (all labs ordered are listed, but only abnormal results are displayed) Labs Reviewed  COMPREHENSIVE METABOLIC PANEL WITH GFR - Abnormal; Notable for the following components:      Result Value   Glucose, Bld 105 (*)    All other components within normal limits  CBC  PREGNANCY, URINE  LIPASE, BLOOD  TROPONIN T, HIGH SENSITIVITY  TROPONIN T, HIGH SENSITIVITY    EKG: EKG Interpretation Date/Time:  Tuesday June 06 2024 17:01:49 EDT Ventricular Rate:  78 PR Interval:  114 QRS Duration:  84 QT Interval:  390 QTC Calculation: 444 R Axis:   74  Text Interpretation: Normal sinus rhythm with sinus arrhythmia Normal ECG No previous ECGs available Confirmed by Jerrol Agent ([id number]) on 06/06/2024 9:40:22 PM  Radiology: CT ABDOMEN PELVIS W CONTRAST Result Date: 06/06/2024 EXAM: CT ABDOMEN AND PELVIS WITH CONTRAST 06/06/2024 08:50:44 PM TECHNIQUE: CT of the abdomen and pelvis was performed with the administration of 85mL iohexol  (OMNIPAQUE ) 300 MG/ML solution. Multiplanar reformatted images are provided for review. Automated exposure control, iterative reconstruction, and/or weight based adjustment of the mA/kV was utilized to reduce the radiation dose to as low as reasonably achievable. COMPARISON: CT dated 06/20/2022. CLINICAL HISTORY: Epigastric pain. Woke with nausea and epigastric pain. Constant. Not relieved with pepto. Able to drink water. FINDINGS: LOWER CHEST: No acute abnormality. LIVER: The  liver is unremarkable. GALLBLADDER AND BILE DUCTS: Gallbladder is unremarkable. No biliary ductal dilatation. SPLEEN: No acute abnormality. PANCREAS: No acute abnormality. ADRENAL GLANDS: No acute abnormality. KIDNEYS, URETERS AND BLADDER: No stones in the kidneys or ureters. No hydronephrosis. No perinephric or periureteral stranding. Urinary bladder is unremarkable. GI AND BOWEL: Normal appendix. Wall thickening and mucosal hyperenhancement about the gastric antrum compatible with gastritis. PERITONEUM AND RETROPERITONEUM: No ascites. No free air. VASCULATURE: Aorta is normal in caliber. LYMPH NODES: No lymphadenopathy. REPRODUCTIVE ORGANS: No acute abnormality. IUD. BONES AND SOFT TISSUES: No acute osseous abnormality. No focal soft tissue abnormality. IMPRESSION: 1. Wall thickening and mucosal hyperenhancement about the gastric antrum compatible with gastritis. Electronically signed by: Norman Gatlin MD 06/06/2024 08:59 PM EDT RP Workstation: [id number]   DG Chest 2 View Result Date: 06/06/2024 CLINICAL DATA:  Chest pain EXAM: CHEST - 2 VIEW COMPARISON:  Chest x-ray 07/19/2020 FINDINGS: The heart size and mediastinal contours are within normal limits. Both lungs are clear. The visualized skeletal structures are unremarkable. IMPRESSION: No active cardiopulmonary disease. Electronically Signed   By: Greig Pique M.D.   On: 06/06/2024 17:21     Procedures   Medications Ordered in the ED  iohexol  (OMNIPAQUE ) 300 MG/ML solution 100 mL (85 mLs Intravenous Contrast Given 06/06/24 2045)                                    Medical Decision Making Patient complains of pain to the epigastric area of her abdomen.  Patient reports pain became severe today.  Patient has had recent increased alcohol intake and recent increase stress  Amount and/or Complexity of Data Reviewed Labs: ordered. Decision-making details documented in ED Course.    Details: Labs ordered reviewed and interpreted.  Urine pregnancy  is negative CBC is normal chemistries are normal lipase is within normal limits. Radiology: ordered and independent interpretation performed. Decision-making details documented in ED Course.    Details: CT abdomen ordered reviewed and interpreted CT scan shows wall thickening and mucosal hyperenhancement about the gastric atrium compatible with gastritis.  Risk Prescription drug management. Risk Details: Patient is counseled on the findings on her CT scan.  Patient has advised alcohol avoidance.  She is counseled on diet restrictions.  Patient is given a prescription for Protonix  she is advised to follow-up with gastroenterology for further evaluation return to the emergency department if symptoms worsen or change.        Final diagnoses:  Acute gastritis without hemorrhage, unspecified gastritis type    ED Discharge Orders          Ordered    pantoprazole  (PROTONIX ) 40 MG tablet  Daily        06/06/24 2144           An After Visit Summary was printed and given to the patient.  Tanasha Menees K, PA-C 06/06/24 2336    Jerrol Agent, MD 06/07/24 [id number]

## 2024-06-06 NOTE — ED Notes (Signed)
 Patient transported to CT

## 2024-06-06 NOTE — ED Notes (Signed)
 AVS provided by edp was reviewed with pt. Pt verbalized understanding with no additional questions at this time. Pharmacy verified with pt.

## 2024-06-09 ENCOUNTER — Telehealth: Payer: Self-pay

## 2024-06-09 NOTE — Telephone Encounter (Signed)
 Transition Care Management Follow-up Telephone Call Date of discharge and from where: 06/06/24 [facility]  How have you been since you were released from the hospital? A little better after starting on Day 3 of Protonix  Any questions or concerns? No  Items Reviewed: Did the pt receive and understand the discharge instructions provided? Yes  Medications obtained and verified? Yes  Other? No  Any new allergies since your discharge? No  Dietary orders reviewed? No Do you have support at home? Yes   Home Care and Equipment/Supplies: Were home health services ordered? not applicable If so, what is the name of the agency?   Has the agency set up a time to come to the patient's home? not applicable Were any new equipment or medical supplies ordered?  No What is the name of the medical supply agency?  Were you able to get the supplies/equipment? not applicable Do you have any questions related to the use of the equipment or supplies? No  Functional Questionnaire: (I = Independent and D = Dependent) ADLs: I  Bathing/Dressing- I  Meal Prep- I  Eating- I  Maintaining continence- I  Transferring/Ambulation- I  Managing Meds- I  Follow up appointments reviewed:  PCP Hospital f/u appt confirmed? Yes  Scheduled to see Alyssa Allwardt, PA-C on 06/14/24 @ 11am. Specialist [facility] f/u appt confirmed? No  Patient calling Gastroenterology to get appt scheduled Are transportation arrangements needed? No  If their condition worsens, is the pt aware to call PCP or go to the Emergency Dept.? Yes Was the patient provided with contact information for the PCP's office or ED? Yes Was to pt encouraged to call back with questions or concerns? Yes

## 2024-06-14 ENCOUNTER — Inpatient Hospital Stay: Admitting: Physician Assistant

## 2024-07-27 ENCOUNTER — Ambulatory Visit: Admitting: Physician Assistant

## 2024-08-07 ENCOUNTER — Other Ambulatory Visit: Payer: Self-pay | Admitting: Neurology

## 2024-08-17 ENCOUNTER — Ambulatory Visit: Admitting: Physician Assistant
